# Patient Record
Sex: Female | Born: 1975 | Race: White | Hispanic: No | Marital: Married | Smoking: Never smoker
Health system: Southern US, Community
[De-identification: ages and names within clinical notes are randomized; demographics above are authoritative.]

## PROBLEM LIST (undated history)

## (undated) DIAGNOSIS — K56609 Unspecified intestinal obstruction, unspecified as to partial versus complete obstruction: Secondary | ICD-10-CM

## (undated) DIAGNOSIS — R002 Palpitations: Secondary | ICD-10-CM

## (undated) DIAGNOSIS — G43909 Migraine, unspecified, not intractable, without status migrainosus: Secondary | ICD-10-CM

## (undated) DIAGNOSIS — R202 Paresthesia of skin: Secondary | ICD-10-CM

## (undated) DIAGNOSIS — I10 Essential (primary) hypertension: Secondary | ICD-10-CM

## (undated) DIAGNOSIS — R1013 Epigastric pain: Secondary | ICD-10-CM

## (undated) DIAGNOSIS — R079 Chest pain, unspecified: Secondary | ICD-10-CM

## (undated) DIAGNOSIS — I81 Portal vein thrombosis: Secondary | ICD-10-CM

## (undated) DIAGNOSIS — E785 Hyperlipidemia, unspecified: Secondary | ICD-10-CM

## (undated) DIAGNOSIS — K649 Unspecified hemorrhoids: Secondary | ICD-10-CM

## (undated) DIAGNOSIS — K449 Diaphragmatic hernia without obstruction or gangrene: Secondary | ICD-10-CM

## (undated) DIAGNOSIS — R200 Anesthesia of skin: Secondary | ICD-10-CM

## (undated) DIAGNOSIS — Z86718 Personal history of other venous thrombosis and embolism: Secondary | ICD-10-CM

## (undated) DIAGNOSIS — G43829 Menstrual migraine, not intractable, without status migrainosus: Secondary | ICD-10-CM

## (undated) DIAGNOSIS — K5090 Crohn's disease, unspecified, without complications: Secondary | ICD-10-CM

## (undated) DIAGNOSIS — K561 Intussusception: Secondary | ICD-10-CM

## (undated) DIAGNOSIS — D649 Anemia, unspecified: Secondary | ICD-10-CM

## (undated) HISTORY — DX: Intussusception: K56.1

## (undated) HISTORY — PX: APPENDECTOMY: SHX54

## (undated) HISTORY — DX: Essential (primary) hypertension: I10

## (undated) HISTORY — DX: Hyperlipidemia, unspecified: E78.5

## (undated) HISTORY — DX: Crohn's disease, unspecified, without complications: K50.90

## (undated) HISTORY — DX: Chest pain, unspecified: R07.9

## (undated) HISTORY — DX: Portal vein thrombosis: I81

## (undated) HISTORY — DX: Unspecified hemorrhoids: K64.9

## (undated) HISTORY — DX: Menstrual migraine, not intractable, without status migrainosus: G43.829

## (undated) HISTORY — DX: Palpitations: R00.2

## (undated) HISTORY — DX: Anemia, unspecified: D64.9

## (undated) HISTORY — DX: Personal history of other venous thrombosis and embolism: Z86.718

## (undated) HISTORY — DX: Unspecified intestinal obstruction, unspecified as to partial versus complete obstruction: K56.609

## (undated) HISTORY — DX: Epigastric pain: R10.13

## (undated) HISTORY — DX: Diaphragmatic hernia without obstruction or gangrene: K44.9

---

## 2005-11-07 ENCOUNTER — Ambulatory Visit: Payer: Self-pay | Admitting: Gastroenterology

## 2006-12-14 HISTORY — PX: COLPOSCOPY: SHX161

## 2007-08-18 ENCOUNTER — Ambulatory Visit: Payer: Self-pay | Admitting: Internal Medicine

## 2007-10-16 ENCOUNTER — Ambulatory Visit: Payer: Self-pay | Admitting: Unknown Physician Specialty

## 2007-10-16 ENCOUNTER — Emergency Department: Payer: Self-pay | Admitting: Emergency Medicine

## 2008-04-16 ENCOUNTER — Emergency Department: Payer: Self-pay | Admitting: Emergency Medicine

## 2008-07-05 ENCOUNTER — Encounter: Payer: Self-pay | Admitting: Maternal & Fetal Medicine

## 2008-11-18 ENCOUNTER — Inpatient Hospital Stay: Payer: Self-pay

## 2010-03-09 ENCOUNTER — Ambulatory Visit: Payer: Self-pay | Admitting: Internal Medicine

## 2011-11-02 ENCOUNTER — Ambulatory Visit: Payer: Self-pay | Admitting: Internal Medicine

## 2013-03-08 ENCOUNTER — Emergency Department: Payer: Self-pay | Admitting: Internal Medicine

## 2013-06-19 ENCOUNTER — Encounter (INDEPENDENT_AMBULATORY_CARE_PROVIDER_SITE_OTHER): Payer: Self-pay

## 2013-06-19 ENCOUNTER — Telehealth: Payer: Self-pay | Admitting: *Deleted

## 2013-06-19 ENCOUNTER — Ambulatory Visit (INDEPENDENT_AMBULATORY_CARE_PROVIDER_SITE_OTHER): Payer: BC Managed Care – PPO | Admitting: Cardiovascular Disease

## 2013-06-19 ENCOUNTER — Encounter: Payer: Self-pay | Admitting: Cardiovascular Disease

## 2013-06-19 VITALS — BP 124/83 | HR 77 | Ht 62.0 in | Wt 127.0 lb

## 2013-06-19 DIAGNOSIS — R0789 Other chest pain: Secondary | ICD-10-CM

## 2013-06-19 DIAGNOSIS — I10 Essential (primary) hypertension: Secondary | ICD-10-CM

## 2013-06-19 DIAGNOSIS — R079 Chest pain, unspecified: Secondary | ICD-10-CM

## 2013-06-19 DIAGNOSIS — R002 Palpitations: Secondary | ICD-10-CM

## 2013-06-19 DIAGNOSIS — E785 Hyperlipidemia, unspecified: Secondary | ICD-10-CM

## 2013-06-19 HISTORY — DX: Hyperlipidemia, unspecified: E78.5

## 2013-06-19 HISTORY — DX: Essential (primary) hypertension: I10

## 2013-06-19 HISTORY — DX: Chest pain, unspecified: R07.9

## 2013-06-19 HISTORY — DX: Palpitations: R00.2

## 2013-06-19 NOTE — Patient Instructions (Addendum)
Your physician has requested that you have a stress echocardiogram. For further information please visit [email or web address] Please follow instruction sheet as given. Hold Atenolol the morning of your stress echo  Your physician recommends that you have the following labs: TSH  Lipid  Cortisol  Aldosterone/Renin Ratio   Your physician recommends that you schedule a follow-up appointment in:  As needed

## 2013-06-19 NOTE — Progress Notes (Signed)
Primary care physician: Dr. Beverely Risen  HPI  This is a pleasant 38 year old female who was referred for evaluation of hypertension and chest pain. She does not have any previous cardiac history. She has known history of hypertension which was diagnosed about 3 years ago as well as anxiety. She reports strong family history of hypertension at early age. There is also family history of premature coronary artery disease. Her first cousin died at the age of 3 for myocardial infarction but she was diabetic. Blood pressure has been reasonably controlled on amlodipine and small dose of atenolol. The dose of atenolol was recently decreased due to slow heart rate. She reports recent episodes of substernal chest pain at rest and with physical activities associated with shortness of breath. She stopped exercising for that reason. She also noticed palpitations and fast heartbeats. She had a Holter monitor done but results are not available yet. She does admit to being anxious about her health and especially cardiac status. She does not smoke. She drinks 2 cups of coffee a day she is not aware of any thyroid disorders.   Allergies  Allergen Reactions  . Iodine      No current outpatient prescriptions on file prior to visit.   No current facility-administered medications on file prior to visit.     Past Medical History  Diagnosis Date  . Hypertension      History reviewed. No pertinent past surgical history.   Family History  Problem Relation Age of Onset  . Hyperlipidemia Mother   . Hypertension Mother      History   Social History  . Marital Status: Single    Spouse Name: N/A    Number of Children: N/A  . Years of Education: N/A   Occupational History  . Not on file.   Social History Main Topics  . Smoking status: Never Smoker   . Smokeless tobacco: Not on file  . Alcohol Use: Yes     Comment: occasional  . Drug Use: No  . Sexual Activity: Not on file   Other Topics  Concern  . Not on file   Social History Narrative  . No narrative on file     ROS A 10 point review of system was performed. It is negative other than that mentioned in the history of present illness.   PHYSICAL EXAM   BP 124/83  Pulse 77  Ht 5\' 2"  (1.575 m)  Wt 127 lb (57.607 kg)  BMI 23.22 kg/m2 Constitutional: She is oriented to person, place, and time. She appears well-developed and well-nourished. No distress.  HENT: No nasal discharge.  Head: Normocephalic and atraumatic.  Eyes: Pupils are equal and round. No discharge.  Neck: Normal range of motion. Neck supple. No JVD present. No thyromegaly present.  Cardiovascular: Normal rate, regular rhythm, normal heart sounds. Exam reveals no gallop and no friction rub. No murmur heard.  Pulmonary/Chest: Effort normal and breath sounds normal. No stridor. No respiratory distress. She has no wheezes. She has no rales. She exhibits no tenderness.  Abdominal: Soft. Bowel sounds are normal. She exhibits no distension. There is no tenderness. There is no rebound and no guarding.  Musculoskeletal: Normal range of motion. She exhibits no edema and no tenderness.  Neurological: She is alert and oriented to person, place, and time. Coordination normal.  Skin: Skin is warm and dry. No rash noted. She is not diaphoretic. No erythema. No pallor.  Psychiatric: She has a normal mood and affect. Her behavior is  normal. Judgment and thought content normal.     AVW:UJWJXBEKG:Normal sinus rhythm with short PR interval. Normal QT interval PRi = 104 BORDERLINE RHYTHM   ASSESSMENT AND PLAN

## 2013-06-19 NOTE — Assessment & Plan Note (Signed)
It is possible that she has essential hypertension especially with her family history. However, given her young age I think it is reasonable to exclude secondary causes. I requested TSH, cortisol level and aldosterone renin ratio.

## 2013-06-19 NOTE — Assessment & Plan Note (Signed)
I agree that there is a component of anxiety which might be contributing. I recommend checking TSH which was requested. I will obtain the results of Holter monitor which was done recently.

## 2013-06-19 NOTE — Assessment & Plan Note (Signed)
Fasting lipid profile to be done with her other labs.

## 2013-06-19 NOTE — Telephone Encounter (Signed)
Lvm to make sure patient holds Atenolol before her stress echo

## 2013-06-19 NOTE — Assessment & Plan Note (Signed)
Chest pain is overall atypical. Risk factors include hypertension and family history of premature coronary artery disease. I recommend evaluation with a stress echocardiogram. Given history of hypertension and associated dyspnea, the echocardiogram will help with EF evaluation.

## 2013-06-24 NOTE — Telephone Encounter (Signed)
Instructed patient to hold Atenolol before her stress echo Patient verbalized understanding

## 2013-07-02 ENCOUNTER — Other Ambulatory Visit: Payer: BC Managed Care – PPO

## 2013-07-09 ENCOUNTER — Other Ambulatory Visit (INDEPENDENT_AMBULATORY_CARE_PROVIDER_SITE_OTHER): Payer: BC Managed Care – PPO

## 2013-07-09 DIAGNOSIS — R079 Chest pain, unspecified: Secondary | ICD-10-CM

## 2013-07-09 DIAGNOSIS — R002 Palpitations: Secondary | ICD-10-CM

## 2013-09-15 ENCOUNTER — Ambulatory Visit (INDEPENDENT_AMBULATORY_CARE_PROVIDER_SITE_OTHER): Payer: BC Managed Care – PPO | Admitting: *Deleted

## 2013-09-15 VITALS — BP 146/90

## 2013-09-15 DIAGNOSIS — I10 Essential (primary) hypertension: Secondary | ICD-10-CM

## 2013-09-15 MED ORDER — AMLODIPINE BESYLATE 5 MG PO TABS: 5.0000 mg | ORAL_TABLET | Freq: Every day | ORAL | Status: DC

## 2013-09-15 MED ORDER — CARVEDILOL 6.25 MG PO TABS: 6.2500 mg | ORAL_TABLET | Freq: Two times a day (BID) | ORAL | Status: DC

## 2013-09-15 NOTE — Patient Instructions (Signed)
Your physician has recommended you make the following change in your medication:  Stop Atenolol  Start Coreg 6.25 mg one tablet twice daily  Decrease Norvasc to 5 mg once daily   Your physician recommends that you return for lab work in:  Schedule your labs for this week  One of them is fasting so come early   Your physician recommends that you schedule a follow-up appointment in:  In 2 weeks with Dr. Kirke CorinArida

## 2013-09-16 ENCOUNTER — Encounter: Payer: BC Managed Care – PPO | Admitting: *Deleted

## 2013-09-17 LAB — LIPID PANEL
Chol/HDL Ratio: 3.2 ratio units (ref 0.0–4.4)
Cholesterol, Total: 266 mg/dL — ABNORMAL HIGH (ref 100–199)
HDL: 83 mg/dL (ref >39)
LDL Calculated: 160 mg/dL — ABNORMAL HIGH (ref 0–99)
Triglycerides: 113 mg/dL (ref 0–149)
VLDL Cholesterol Cal: 23 mg/dL (ref 5–40)

## 2013-09-17 LAB — TSH: TSH: 0.766 u[IU]/mL (ref 0.450–4.500)

## 2013-09-17 LAB — CORTISOL: Cortisol: 15.7 ug/dL (ref 2.3–19.4)

## 2013-09-17 NOTE — Progress Notes (Signed)
This encounter was created in error - please disregard.

## 2013-09-18 ENCOUNTER — Telehealth: Payer: Self-pay

## 2013-09-18 ENCOUNTER — Encounter: Payer: BC Managed Care – PPO | Admitting: *Deleted

## 2013-09-18 LAB — ALDOSTERONE/RENIN RATIO
ALDOS/RENIN RATIO: 5.3 (ref 0.0–30.0)
ALDOSTERONE: 49.4 ng/dL — ABNORMAL HIGH (ref 0.0–30.0)

## 2013-09-18 NOTE — Telephone Encounter (Signed)
Pt called and states her BP is still elevated 150/107, states the bottom # is not coming off 101. Feels heavy chested.

## 2013-09-18 NOTE — Telephone Encounter (Signed)
Informed patient per verbal from Dr. Kirke Corin to take extra 5 mg of Norvasc ( for a daily total of 10 mg) if blood pressure is elevated   Reviewed preliminary lab results with patient   Advised her not to use a wrist cuff to check her blood pressure  Advised her to contact ems if she feels her situation becomes emergent   Patient verbalized understanding

## 2013-09-18 NOTE — Progress Notes (Signed)
Patient walked in clinic 09/15/13 stating that she "felt funny" at work  She checked her blood pressure at work 150/107 In clinic her blood pressure was 146/90 She stated when her pressure is elevated she feels lightheaded and short of breath  Denies any other symptoms   Discussed this with Dr.Arida  He advised patient    Stop Atenolol  Start Coreg 6.25 mg one tablet twice daily  Decrease Norvasc to 5 mg once daily    Schedule your labs for this week that were ordered at previous visit   Follow up in 2 weeks with Dr. Kirke Corin    Patient verbalized understanding

## 2013-09-21 NOTE — Progress Notes (Signed)
This encounter was created in error - please disregard.

## 2013-10-08 ENCOUNTER — Ambulatory Visit (INDEPENDENT_AMBULATORY_CARE_PROVIDER_SITE_OTHER): Payer: BC Managed Care – PPO | Admitting: Cardiovascular Disease

## 2013-10-08 ENCOUNTER — Encounter: Payer: Self-pay | Admitting: Cardiovascular Disease

## 2013-10-08 VITALS — BP 130/91 | HR 73 | Ht 63.0 in | Wt 125.2 lb

## 2013-10-08 DIAGNOSIS — I10 Essential (primary) hypertension: Secondary | ICD-10-CM

## 2013-10-08 DIAGNOSIS — E785 Hyperlipidemia, unspecified: Secondary | ICD-10-CM

## 2013-10-08 MED ORDER — LOSARTAN POTASSIUM-HCTZ 50-12.5 MG PO TABS: 1.0000 | ORAL_TABLET | Freq: Every day | ORAL | Status: DC

## 2013-10-08 NOTE — Patient Instructions (Signed)
Your physician has recommended you make the following change in your medication:  Stop Amlodipine  Start Losartan-HCTZ 50/12.5 mg once daily  Your physician has requested that you have a renal artery duplex. During this test, an ultrasound is used to evaluate blood flow to the kidneys. Allow one hour for this exam. Do not eat after midnight the day before and avoid carbonated beverages. Take your medications as you usually do.   Your physician recommends that you return for lab work in:  BMP the day of your renal artery duplex  Your physician recommends that you schedule a follow-up appointment in: 3 months

## 2013-10-08 NOTE — Progress Notes (Signed)
Primary care physician: Dr. Beverely Risen  HPI  This is a pleasant 38 year old female who is here today for a followup visit regarding  hypertension and chest pain. She does not have any previous cardiac history. She has known history of hypertension which was diagnosed about 3 years ago as well as anxiety. She reports strong family history of hypertension at early age. There is also family history of premature coronary artery disease. She was seen recently for  episodes of substernal chest pain at rest and with physical activities associated with shortness of breath.  She underwent a stress echocardiogram which showed no evidence of ischemia. There was hypertensive response to exercise on was switched from atenolol to carvedilol. She reports improvement in symptoms since then. However, blood pressure is still not optimally controlled.   Allergies  Allergen Reactions  . Iodine      Current Outpatient Prescriptions on File Prior to Visit  Medication Sig Dispense Refill  . ALPRAZolam (XANAX) 0.25 MG tablet Take 0.5 mg by mouth as needed.       Marland Kitchen amLODipine (NORVASC) 5 MG tablet Take 1 tablet (5 mg total) by mouth daily.  180 tablet  3  . carvedilol (COREG) 6.25 MG tablet Take 1 tablet (6.25 mg total) by mouth 2 (two) times daily.  180 tablet  3   No current facility-administered medications on file prior to visit.     Past Medical History  Diagnosis Date  . Hypertension      History reviewed. No pertinent past surgical history.   Family History  Problem Relation Age of Onset  . Hyperlipidemia Mother   . Hypertension Mother      History   Social History  . Marital Status: Single    Spouse Name: N/A    Number of Children: N/A  . Years of Education: N/A   Occupational History  . Not on file.   Social History Main Topics  . Smoking status: Never Smoker   . Smokeless tobacco: Not on file  . Alcohol Use: Yes     Comment: occasional  . Drug Use: No  . Sexual Activity:  Not on file   Other Topics Concern  . Not on file   Social History Narrative  . No narrative on file     ROS A 10 point review of system was performed. It is negative other than that mentioned in the history of present illness.   PHYSICAL EXAM   BP 130/91  Pulse 73  Ht  (1.6 m)  Wt 125 lb 4 oz (56.813 kg)  BMI 22.19 kg/m2 Constitutional: She is oriented to person, place, and time. She appears well-developed and well-nourished. No distress.  HENT: No nasal discharge.  Head: Normocephalic and atraumatic.  Eyes: Pupils are equal and round. No discharge.  Neck: Normal range of motion. Neck supple. No JVD present. No thyromegaly present.  Cardiovascular: Normal rate, regular rhythm, normal heart sounds. Exam reveals no gallop and no friction rub. No murmur heard.  Pulmonary/Chest: Effort normal and breath sounds normal. No stridor. No respiratory distress. She has no wheezes. She has no rales. She exhibits no tenderness.  Abdominal: Soft. Bowel sounds are normal. She exhibits no distension. There is no tenderness. There is no rebound and no guarding. No abdominal bruit  Musculoskeletal: Normal range of motion. She exhibits no edema and no tenderness.  Neurological: She is alert and oriented to person, place, and time. Coordination normal.  Skin: Skin is warm and dry. No rash  noted. She is not diaphoretic. No erythema. No pallor.  Psychiatric: She has a normal mood and affect. Her behavior is normal. Judgment and thought content normal.      ASSESSMENT AND PLAN

## 2013-10-08 NOTE — Assessment & Plan Note (Signed)
Lab Results  Component Value Date   HDL 83 09/16/2013   LDLCALC 974* 09/16/2013   TRIG 113 09/16/2013   CHOLHDL 3.2 09/16/2013   We discussed the importance of lifestyle changes. Recommend repeat lipid profile in 6 months.

## 2013-10-08 NOTE — Assessment & Plan Note (Signed)
Blood pressure continues to be not optimally controlled. Workup for secondary hypertension has been negative. However, given the persistently high diastolic blood pressure, I think we have to exclude fibromuscular dysplasia causing renal artery stenosis. I requested a renal artery duplex. I stopped amlodipine and started losartan-hydrochlorothiazide. Check basic metabolic profile in 7-10 days.

## 2013-11-03 ENCOUNTER — Other Ambulatory Visit (INDEPENDENT_AMBULATORY_CARE_PROVIDER_SITE_OTHER): Payer: BC Managed Care – PPO | Admitting: *Deleted

## 2013-11-03 ENCOUNTER — Encounter (INDEPENDENT_AMBULATORY_CARE_PROVIDER_SITE_OTHER): Payer: BC Managed Care – PPO

## 2013-11-03 DIAGNOSIS — I10 Essential (primary) hypertension: Secondary | ICD-10-CM

## 2013-11-04 LAB — BASIC METABOLIC PANEL
BUN/Creatinine Ratio: 16 (ref 8–20)
BUN: 13 mg/dL (ref 6–20)
CO2: 24 mmol/L (ref 18–29)
Calcium: 9.5 mg/dL (ref 8.7–10.2)
Chloride: 103 mmol/L (ref 97–108)
Creatinine, Ser: 0.79 mg/dL (ref 0.57–1.00)
GFR calc Af Amer: 110 mL/min/{1.73_m2} (ref >59)
GFR calc non Af Amer: 95 mL/min/{1.73_m2} (ref >59)
Glucose: 90 mg/dL (ref 65–99)
Potassium: 4.9 mmol/L (ref 3.5–5.2)
Sodium: 142 mmol/L (ref 134–144)

## 2013-11-05 ENCOUNTER — Telehealth: Payer: Self-pay

## 2013-11-05 NOTE — Telephone Encounter (Signed)
I am not able to refill Xanax.

## 2013-11-05 NOTE — Telephone Encounter (Signed)
Pt called, states she needs a refill on her Xanax, states Dr. Beverely Risen will not refill until she is seen. States she has been seen here in our office a lot lately, and didn't know if Dr. Kirke Corin could fill this for her or not. Please call and advise.

## 2013-11-05 NOTE — Telephone Encounter (Signed)
Informed patient of Dr. Aridas response  Patient verbalized understanding  

## 2013-11-12 ENCOUNTER — Ambulatory Visit: Payer: BC Managed Care – PPO | Admitting: *Deleted

## 2013-11-12 ENCOUNTER — Telehealth: Payer: Self-pay

## 2013-11-12 VITALS — BP 122/86 | HR 77

## 2013-11-12 DIAGNOSIS — E785 Hyperlipidemia, unspecified: Secondary | ICD-10-CM

## 2013-11-12 MED ORDER — LOSARTAN POTASSIUM 50 MG PO TABS: 50.0000 mg | ORAL_TABLET | Freq: Every day | ORAL | Status: DC

## 2013-11-12 MED ORDER — CARVEDILOL 3.125 MG PO TABS: 3.1250 mg | ORAL_TABLET | Freq: Two times a day (BID) | ORAL | Status: DC

## 2013-11-12 NOTE — Progress Notes (Signed)
Patient walked into the office today complaining of chest tightness and a full feeling in her throat  She does not know if she is having a "problem" or anxiety  She also had a hot flash and a strange pain in her left arm  She stated that she knows she has some anxiety but is concerned because of her elevated cholesterol   Vitals and EKG were obtained  EKG was normal per Dr. Phillips Odor were WDL   She also stated that she weaned herself off Coreg on her own  She said her blood pressure has been running low  SBP consistently in the 90's  She had been feeling very lethargic   Patient would like to know if her cholesterol has come down any

## 2013-11-12 NOTE — Patient Instructions (Addendum)
Your physician has recommended you make the following change in your medication:  Decrease Coreg to 3.125 mg twice daily  Stop Hyzaar  Start Losartan 50 mg once daily   Your physician recommends that you return for lab work in:  Fasting labs next week

## 2013-11-12 NOTE — Telephone Encounter (Signed)
Pt states she has had palpitations, her shoulder is aching, and she "doesn't feel right", SOB, states she had a "hot flash, and a hard time swallowing"

## 2013-11-12 NOTE — Telephone Encounter (Signed)
Patient seen by nurse in clinic

## 2013-11-16 ENCOUNTER — Encounter: Payer: Self-pay | Admitting: Cardiovascular Disease

## 2013-11-17 ENCOUNTER — Other Ambulatory Visit: Payer: BC Managed Care – PPO

## 2013-11-19 ENCOUNTER — Other Ambulatory Visit: Payer: BC Managed Care – PPO

## 2013-11-27 ENCOUNTER — Telehealth: Payer: Self-pay

## 2013-11-27 NOTE — Telephone Encounter (Signed)
LVM @ 1016

## 2013-11-27 NOTE — Telephone Encounter (Signed)
Pt states she was recently switched to Cozaar, and is experiencing a lot of bloating and swelling. Please call.

## 2013-11-27 NOTE — Telephone Encounter (Signed)
Patient called and stated that ever since her meds were changed during her RN visit by Eula Listen PA she has felt bloated and swollen  She wonders if this is because we stopped the diuretic component of her medication  She said her stomach and hands feel bloated  I advised her to avoid sodium and see how she adjusts over the weekend

## 2013-12-02 NOTE — Telephone Encounter (Signed)
LVM to follow up w patient 10/21

## 2013-12-04 NOTE — Telephone Encounter (Signed)
I attempted to call the patient. No answer/ voice mail full.

## 2013-12-04 NOTE — Telephone Encounter (Signed)
Patient called and stated her swelling has improve  I instructed her to call with any changes  Patient verbalized understanding

## 2013-12-12 ENCOUNTER — Emergency Department: Payer: Self-pay | Admitting: Student

## 2013-12-12 LAB — URINALYSIS, COMPLETE
Bilirubin,UR: NEGATIVE
Blood: NEGATIVE
Glucose,UR: NEGATIVE mg/dL (ref 0–75)
Leukocyte Esterase: NEGATIVE
Nitrite: NEGATIVE
Ph: 5 (ref 4.5–8.0)
Protein: NEGATIVE
RBC,UR: 6 /HPF (ref 0–5)
Specific Gravity: 1.028 (ref 1.003–1.030)
Squamous Epithelial: 4
WBC UR: 1 /HPF (ref 0–5)

## 2013-12-12 LAB — CBC WITH DIFFERENTIAL/PLATELET
Basophil #: 0 10*3/uL (ref 0.0–0.1)
Basophil %: 0.7 %
Eosinophil #: 0 10*3/uL (ref 0.0–0.7)
Eosinophil %: 0 %
HCT: 38.4 % (ref 35.0–47.0)
HGB: 12.3 g/dL (ref 12.0–16.0)
Lymphocyte #: 0.6 10*3/uL — ABNORMAL LOW (ref 1.0–3.6)
Lymphocyte %: 7.7 %
MCH: 28.4 pg (ref 26.0–34.0)
MCHC: 32.1 g/dL (ref 32.0–36.0)
MCV: 88 fL (ref 80–100)
Monocyte #: 0.4 x10 3/mm (ref 0.2–0.9)
Monocyte %: 5.1 %
Neutrophil #: 6.2 10*3/uL (ref 1.4–6.5)
Neutrophil %: 86.5 %
Platelet: 208 10*3/uL (ref 150–440)
RBC: 4.34 10*6/uL (ref 3.80–5.20)
RDW: 16.4 % — ABNORMAL HIGH (ref 11.5–14.5)
WBC: 7.2 10*3/uL (ref 3.6–11.0)

## 2013-12-12 LAB — COMPREHENSIVE METABOLIC PANEL
Albumin: 3.6 g/dL (ref 3.4–5.0)
Alkaline Phosphatase: 70 U/L
Anion Gap: 11 (ref 7–16)
BUN: 10 mg/dL (ref 7–18)
Bilirubin,Total: 0.4 mg/dL (ref 0.2–1.0)
Calcium, Total: 8.5 mg/dL (ref 8.5–10.1)
Chloride: 105 mmol/L (ref 98–107)
Co2: 24 mmol/L (ref 21–32)
Creatinine: 0.67 mg/dL (ref 0.60–1.30)
EGFR (African American): >60
EGFR (Non-African Amer.): >60
Glucose: 129 mg/dL — ABNORMAL HIGH (ref 65–99)
Osmolality: 280 (ref 275–301)
Potassium: 3.6 mmol/L (ref 3.5–5.1)
SGOT(AST): 29 U/L (ref 15–37)
SGPT (ALT): 22 U/L
Sodium: 140 mmol/L (ref 136–145)
Total Protein: 7.8 g/dL (ref 6.4–8.2)

## 2013-12-12 LAB — ED INFLUENZA
H1N1 flu by pcr: NOT DETECTED
Influenza A By PCR: NEGATIVE
Influenza B By PCR: NEGATIVE

## 2013-12-12 LAB — LIPASE, BLOOD: Lipase: 70 U/L — ABNORMAL LOW (ref 73–393)

## 2013-12-13 ENCOUNTER — Emergency Department (HOSPITAL_COMMUNITY)
Admission: EM | Admit: 2013-12-13 | Discharge: 2013-12-13 | Disposition: A | Discharge status: Home / Self Care | Payer: BC Managed Care – PPO | Attending: Emergency Medicine | Admitting: Emergency Medicine

## 2013-12-13 ENCOUNTER — Emergency Department (HOSPITAL_COMMUNITY): Payer: BC Managed Care – PPO

## 2013-12-13 ENCOUNTER — Encounter (HOSPITAL_COMMUNITY): Payer: Self-pay | Admitting: Family Medicine

## 2013-12-13 DIAGNOSIS — R000 Tachycardia, unspecified: Secondary | ICD-10-CM | POA: Diagnosis not present

## 2013-12-13 DIAGNOSIS — R0602 Shortness of breath: Secondary | ICD-10-CM

## 2013-12-13 DIAGNOSIS — I10 Essential (primary) hypertension: Secondary | ICD-10-CM | POA: Diagnosis not present

## 2013-12-13 DIAGNOSIS — R079 Chest pain, unspecified: Secondary | ICD-10-CM | POA: Diagnosis present

## 2013-12-13 DIAGNOSIS — H748X1 Other specified disorders of right middle ear and mastoid: Secondary | ICD-10-CM | POA: Insufficient documentation

## 2013-12-13 DIAGNOSIS — J189 Pneumonia, unspecified organism: Secondary | ICD-10-CM

## 2013-12-13 DIAGNOSIS — Z8639 Personal history of other endocrine, nutritional and metabolic disease: Secondary | ICD-10-CM | POA: Diagnosis not present

## 2013-12-13 DIAGNOSIS — Z79899 Other long term (current) drug therapy: Secondary | ICD-10-CM | POA: Insufficient documentation

## 2013-12-13 DIAGNOSIS — J159 Unspecified bacterial pneumonia: Secondary | ICD-10-CM | POA: Diagnosis not present

## 2013-12-13 LAB — BASIC METABOLIC PANEL
Anion gap: 13 (ref 5–15)
BUN: 6 mg/dL (ref 6–23)
CO2: 23 mEq/L (ref 19–32)
Calcium: 9.1 mg/dL (ref 8.4–10.5)
Chloride: 103 mEq/L (ref 96–112)
Creatinine, Ser: 0.63 mg/dL (ref 0.50–1.10)
GFR calc Af Amer: >90 mL/min (ref >90)
GFR calc non Af Amer: >90 mL/min (ref >90)
Glucose, Bld: 98 mg/dL (ref 70–99)
Potassium: 3.4 mEq/L — ABNORMAL LOW (ref 3.7–5.3)
Sodium: 139 mEq/L (ref 137–147)

## 2013-12-13 LAB — CBC
HCT: 37.9 % (ref 36.0–46.0)
Hemoglobin: 12.6 g/dL (ref 12.0–15.0)
MCH: 28.4 pg (ref 26.0–34.0)
MCHC: 33.2 g/dL (ref 30.0–36.0)
MCV: 85.6 fL (ref 78.0–100.0)
Platelets: 201 10*3/uL (ref 150–400)
RBC: 4.43 MIL/uL (ref 3.87–5.11)
RDW: 15.7 % — ABNORMAL HIGH (ref 11.5–15.5)
WBC: 5.1 10*3/uL (ref 4.0–10.5)

## 2013-12-13 LAB — I-STAT TROPONIN, ED: Troponin i, poc: 0.01 ng/mL (ref 0.00–0.08)

## 2013-12-13 LAB — PRO B NATRIURETIC PEPTIDE: Pro B Natriuretic peptide (BNP): 319.3 pg/mL — ABNORMAL HIGH (ref 0–125)

## 2013-12-13 MED ORDER — SODIUM CHLORIDE 0.9 % IV BOLUS (SEPSIS)
1000.0000 mL | INTRAVENOUS | Status: AC
  Administered 2013-12-13: 1000 mL via INTRAVENOUS

## 2013-12-13 MED ORDER — OXYCODONE-ACETAMINOPHEN 5-325 MG PO TABS
1.0000 | ORAL_TABLET | Freq: Once | ORAL | Status: AC
  Administered 2013-12-13: 1 via ORAL
  Filled 2013-12-13: qty 1

## 2013-12-13 NOTE — Discharge Instructions (Signed)
Your shortness of breath, chest discomfort are likely due to your previous diagnosis of pneumonia. It is important for you to maintain proper hydration. Continue with your previously scheduled antibiotic regimen. Follow-up with your primary care doctor to ensure resolution of your pneumonia. Please return to ED for further evaluation if you experience fevers that do not break, difficulty breathing, blood in your cough, abdominal pain, numbness or weakness

## 2013-12-13 NOTE — ED Notes (Signed)
Dr Harrison at bedside

## 2013-12-13 NOTE — ED Notes (Addendum)
Pt c/o L sided chest pain, non radiating starting today. Pt diagnosed with pneumonia two days ago; has been taking antibiotics as prescribed. Reports productive cough with clear sputum. Chest pain associated with NV, dizziness, and increased shortness of breath. Pt also c/o bilateral ear pain, worse on R side. Rhonchi heard bilaterally. Reports fever and chills; has been alternating tylenol and ibuprofen for fever; last tylenol at 1500

## 2013-12-13 NOTE — ED Provider Notes (Signed)
CSN: [id number]     Arrival date & time 12/13/13  1549 History   First MD Initiated Contact with Patient 12/13/13 1733     Chief Complaint  Patient presents with  . Chest Pain  . Tachycardia     (Consider location/radiation/quality/duration/timing/severity/associated sxs/prior Treatment) HPI Chelsea Carroll is a 38 y.o. female history of hypertension, hypercholesterolemia who comes in for evaluation of chest pain. Patient was diagnosed 2 days ago at [facility] with upper left lobe pneumonia., Currently on azithromycin. Patient states this morning at 1 or 2 AM she began experiencing a "deep ache" in her chest that lasted approximately 3 seconds and abated. The pain will return intermittently without any specific timing or relation to exertion.  Past Medical History  Diagnosis Date  . Hypertension    History reviewed. No pertinent past surgical history. Family History  Problem Relation Age of Onset  . Hyperlipidemia Mother   . Hypertension Mother    History  Substance Use Topics  . Smoking status: Never Smoker   . Smokeless tobacco: Not on file  . Alcohol Use: Yes     Comment: occasional   OB History    No data available     Review of Systems  Constitutional: Positive for fever.  HENT: Positive for ear pain. Negative for sore throat.   Eyes: Negative for visual disturbance.  Respiratory: Positive for shortness of breath.   Cardiovascular: Positive for chest pain.  Gastrointestinal: Negative for abdominal pain.  Endocrine: Negative for polyuria.  Genitourinary: Negative for dysuria.  Skin: Negative for rash.  Neurological: Negative for headaches.      Allergies  Iodine  Home Medications   Prior to Admission medications   Medication Sig Start Date End Date Taking? Authorizing Provider  acetaminophen (TYLENOL) 500 MG tablet Take 500 mg by mouth every 6 (six) hours as needed.   Yes Historical Provider, MD  ALPRAZolam Prudy Feeler) 0.25 MG tablet Take  0.5 mg by mouth as needed.  06/15/13  Yes Historical Provider, MD  carvedilol (COREG) 3.125 MG tablet Take 1 tablet (3.125 mg total) by mouth 2 (two) times daily. 11/12/13  Yes Ryan M Dunn, PA-C  cyclobenzaprine (FLEXERIL) 5 MG tablet Take 5 mg by mouth as needed.  09/15/13  Yes Historical Provider, MD  losartan (COZAAR) 50 MG tablet Take 1 tablet (50 mg total) by mouth daily. 11/12/13  Yes Ryan M Dunn, PA-C   BP 124/81 mmHg  Pulse 91  Temp(Src) 99 F (37.2 C) (Oral)  Resp 17  SpO2 99%  LMP 12/13/2013 Physical Exam  Constitutional: She is oriented to person, place, and time. She appears well-developed and well-nourished.  HENT:  Head: Normocephalic and atraumatic.  Mouth/Throat: Oropharynx is clear and moist. No oropharyngeal exudate.  Mild effusion behind right tympanic membrane. No erythema no evidence of infection. Left TM normal  Eyes: Conjunctivae are normal. Pupils are equal, round, and reactive to light. Right eye exhibits no discharge. Left eye exhibits no discharge. No scleral icterus.  Neck: Neck supple. No JVD present.  Cardiovascular: Regular rhythm and normal heart sounds.   Pulmonary/Chest: Effort normal. No respiratory distress. She has no wheezes. She has no rales.  Abdominal: Soft. She exhibits no distension and no mass. There is no tenderness. There is no rebound and no guarding.  Musculoskeletal: She exhibits no tenderness.  Neurological: She is alert and oriented to person, place, and time.  Cranial Nerves II-XII grossly intact  Skin: Skin is warm and dry. No rash noted.  Psychiatric: She has a normal mood and affect.  Nursing note and vitals reviewed.   ED Course  Procedures (including critical care time) Labs Review Labs Reviewed  CBC - Abnormal; Notable for the following:    RDW 15.7 (*)    All other components within normal limits  BASIC METABOLIC PANEL - Abnormal; Notable for the following:    Potassium 3.4 (*)    All other components within normal limits   PRO B NATRIURETIC PEPTIDE - Abnormal; Notable for the following:    Pro B Natriuretic peptide (BNP) 319.3 (*)    All other components within normal limits  Rosezena SensorI-STAT TROPOININ, ED    Imaging Review Dg Chest 2 View  12/13/2013   CLINICAL DATA:  Diagnosed with pneumonia. Progression of the cough, chest tightness and shortness of breath.  EXAM: CHEST  2 VIEW  COMPARISON:  None.  FINDINGS: Two views of the chest demonstrate extensive airspace densities in the left perihilar and left upper lung region. Findings are most compatible with a left upper lobe pneumonia. Right lung is clear. There is levoscoliosis in the upper thoracic spine. Difficult to excluded a trace amount of left pleural fluid. Heart size is normal.  IMPRESSION: Airspace disease in the left upper lobe. Findings are compatible with pneumonia. Recommend follow up to ensure resolution.  Scoliosis in the upper thoracic spine.   Electronically Signed   By: Richarda OverlieAdam  Henn M.D.   On: 12/13/2013 17:25     EKG Interpretation   Date/Time:  Sunday December 13 2013 15:56:09 EST Ventricular Rate:  112 PR Interval:  146 QRS Duration: 92 QT Interval:  306 QTC Calculation: 417 R Axis:   95 Text Interpretation:  Sinus tachycardia Rightward axis Incomplete right  bundle branch block ST \\T \ T wave abnormality, consider inferior ischemia  ST \\T \ T wave abnormality, consider anterior ischemia no previous  Confirmed by HARRISON  MD, FORREST ([id number]) on 12/13/2013 4:57:05 PM     Meds given in ED:  Medications  sodium chloride 0.9 % bolus 1,000 mL (0 mLs Intravenous Stopped 12/13/13 2053)  oxyCODONE-acetaminophen (PERCOCET/ROXICET) 5-325 MG per tablet 1 tablet (1 tablet Oral Given 12/13/13 1922)    Discharge Medication List as of 12/13/2013  8:50 PM     Filed Vitals:   12/13/13 1930 12/13/13 1954 12/13/13 2015 12/13/13 2019  BP: 123/79 123/79 124/81   Pulse: 98 95 91   Temp:    99 F (37.2 C)  TempSrc:    Oral  Resp: 22 15 17    SpO2: 98% 94%  99%     MDM  Vitals stable - WNL -afebrile. Patient's fever reduced in ED Pt resting comfortably in ED. Pain controlled in ED. Patient states she feels much better after fluid bolus and pain medicine. PE consistent with patient's previous diagnosis of pneumonia. Low concern for other acute or emergent pathology. Patient symptomology likely from current pneumonia Labwork noncontributory Imaging--chest x-ray consistent with left upper lobe pneumonia, no other evidence of cardiopulmonary pathology. Encouraged patient to continue antibiotic regimen, symptomatically care and to follow-up with her primary care to ensure pneumonia resolution.  Discussed f/u with PCP and return precautions, pt very amenable to plan. Patient stable, in good condition and is appropriate for discharge. Prior to patient discharge, I discussed and reviewed this case with Dr.Harrison     Final diagnoses:  Community acquired pneumonia        Sharlene MottsBenjamin W Erika Slaby, PA-C 12/14/13 1141

## 2013-12-13 NOTE — ED Notes (Signed)
Per pt sts chest tightness and tach. sts seen 2 days ago and dx with Pneumonia. sts pain is worse and hard to breathe.

## 2013-12-13 NOTE — ED Notes (Signed)
Pt back from x-ray.

## 2013-12-15 ENCOUNTER — Telehealth: Payer: Self-pay | Admitting: Cardiovascular Disease

## 2013-12-15 NOTE — Telephone Encounter (Signed)
Pt had questions about meds that were given to her. bp is high and pt thinks the meds may help but at the same time it may higher the bp. Pt has not picked up the meds.

## 2013-12-16 NOTE — Telephone Encounter (Signed)
Informed patient of Dr. Aridas response  Patient verbalized understanding  

## 2013-12-16 NOTE — Telephone Encounter (Signed)
Patient currently has pneumonia  Her blood pressure has been a little elevated since she has been sick  140/98 has been her average  Her primary care provider prescribed a prednisone taper  She is afraid to take it  She wants to make sure it is all right with Dr. Kirke Corin for her to take it

## 2013-12-16 NOTE — Telephone Encounter (Signed)
Short term prednisone should be fine.

## 2013-12-18 ENCOUNTER — Ambulatory Visit: Payer: Self-pay | Admitting: Physician Assistant

## 2013-12-25 ENCOUNTER — Ambulatory Visit: Payer: Self-pay | Admitting: Physician Assistant

## 2013-12-30 ENCOUNTER — Telehealth: Payer: Self-pay

## 2013-12-30 NOTE — Telephone Encounter (Signed)
Pt states she has had pneumonia, and plural effusion. States her "heart races " every night, she has to sit up. States she can "feel the pneumonia is still there". States she is concerned about some other things. Please call.

## 2013-12-30 NOTE — Telephone Encounter (Signed)
No answer no vm 11/18

## 2014-01-15 ENCOUNTER — Ambulatory Visit (INDEPENDENT_AMBULATORY_CARE_PROVIDER_SITE_OTHER): Payer: BC Managed Care – PPO | Admitting: Cardiovascular Disease

## 2014-01-15 VITALS — BP 122/88 | HR 71

## 2014-01-15 DIAGNOSIS — E785 Hyperlipidemia, unspecified: Secondary | ICD-10-CM

## 2014-01-15 DIAGNOSIS — R0789 Other chest pain: Secondary | ICD-10-CM

## 2014-01-15 DIAGNOSIS — I10 Essential (primary) hypertension: Secondary | ICD-10-CM

## 2014-01-15 NOTE — Patient Instructions (Signed)
Follow up as needed

## 2014-01-15 NOTE — Assessment & Plan Note (Signed)
No further episodes of chest pain. 

## 2014-01-15 NOTE — Assessment & Plan Note (Signed)
Blood pressure is well controlled on current medications. She can follow-up as needed.

## 2014-01-15 NOTE — Assessment & Plan Note (Signed)
Lab Results  Component Value Date   HDL 83 09/16/2013   LDLCALC 160* 09/16/2013   TRIG 113 09/16/2013   CHOLHDL 3.2 09/16/2013   We discussed the importance of lifestyle changes. Recommend repeat lipid profile after 3-6 months of lifestyle changes.

## 2014-01-15 NOTE — Progress Notes (Signed)
Primary care physician: Dr. Beverely RisenFozia Khan  HPI  This is a pleasant 38 year old female who is here today for a followup visit regarding  hypertension and chest pain. She does not have any previous cardiac history. She has known history of hypertension which was diagnosed about 3 years ago as well as anxiety. She reports strong family history of hypertension at early age. There is also family history of premature coronary artery disease. She was seen in May for  episodes of substernal chest pain at rest and with physical activities associated with shortness of breath.  She underwent a stress echocardiogram which showed no evidence of ischemia. There was hypertensive response to exercise on was switched from atenolol to carvedilol. BP continued to be elevated. Renal artery duplex showed no evidence of renal artery stenosis.  She had pneumonia in early November. She recovered from this. She has been doing well.   Allergies  Allergen Reactions  . Codeine     Other reaction(s): Unknown  . Iodine Hives     Current Outpatient Prescriptions on File Prior to Visit  Medication Sig Dispense Refill  . acetaminophen (TYLENOL) 500 MG tablet Take 500 mg by mouth every 6 (six) hours as needed.    . ALPRAZolam (XANAX) 0.25 MG tablet Take 0.5 mg by mouth as needed.     . carvedilol (COREG) 3.125 MG tablet Take 1 tablet (3.125 mg total) by mouth 2 (two) times daily. 30 tablet 6  . cyclobenzaprine (FLEXERIL) 5 MG tablet Take 5 mg by mouth as needed.     Marland Kitchen. losartan (COZAAR) 50 MG tablet Take 1 tablet (50 mg total) by mouth daily. 30 tablet 6   No current facility-administered medications on file prior to visit.     Past Medical History  Diagnosis Date  . Hypertension      No past surgical history on file.   Family History  Problem Relation Age of Onset  . Hyperlipidemia Mother   . Hypertension Mother      History   Social History  . Marital Status: Married    Spouse Name: N/A    Number of  Children: N/A  . Years of Education: N/A   Occupational History  . Not on file.   Social History Main Topics  . Smoking status: Never Smoker   . Smokeless tobacco: Not on file  . Alcohol Use: Yes     Comment: occasional  . Drug Use: No  . Sexual Activity: Not on file   Other Topics Concern  . Not on file   Social History Narrative     ROS A 10 point review of system was performed. It is negative other than that mentioned in the history of present illness.   PHYSICAL EXAM   BP 122/88 mmHg  Pulse 71 Constitutional: She is oriented to person, place, and time. She appears well-developed and well-nourished. No distress.  HENT: No nasal discharge.  Head: Normocephalic and atraumatic.  Eyes: Pupils are equal and round. No discharge.  Neck: Normal range of motion. Neck supple. No JVD present. No thyromegaly present.  Cardiovascular: Normal rate, regular rhythm, normal heart sounds. Exam reveals no gallop and no friction rub. No murmur heard.  Pulmonary/Chest: Effort normal and breath sounds normal. No stridor. No respiratory distress. She has no wheezes. She has no rales. She exhibits no tenderness.  Abdominal: Soft. Bowel sounds are normal. She exhibits no distension. There is no tenderness. There is no rebound and no guarding. No abdominal bruit  Musculoskeletal:  Normal range of motion. She exhibits no edema and no tenderness.  Neurological: She is alert and oriented to person, place, and time. Coordination normal.  Skin: Skin is warm and dry. No rash noted. She is not diaphoretic. No erythema. No pallor.  Psychiatric: She has a normal mood and affect. Her behavior is normal. Judgment and thought content normal.   EKG: Normal sinus rhythm   ASSESSMENT AND PLAN

## 2014-01-28 ENCOUNTER — Encounter: Payer: Self-pay | Admitting: Cardiovascular Disease

## 2014-12-13 ENCOUNTER — Other Ambulatory Visit: Payer: Self-pay

## 2014-12-13 MED ORDER — CARVEDILOL 3.125 MG PO TABS: 3.1250 mg | ORAL_TABLET | Freq: Two times a day (BID) | ORAL | Status: DC

## 2014-12-24 ENCOUNTER — Other Ambulatory Visit: Payer: Self-pay | Admitting: *Deleted

## 2014-12-24 MED ORDER — LOSARTAN POTASSIUM 50 MG PO TABS: 50.0000 mg | ORAL_TABLET | Freq: Every day | ORAL | Status: DC

## 2015-02-23 ENCOUNTER — Other Ambulatory Visit: Payer: Self-pay

## 2015-02-23 ENCOUNTER — Telehealth: Payer: Self-pay | Admitting: *Deleted

## 2015-02-23 MED ORDER — LOSARTAN POTASSIUM 50 MG PO TABS: 50.0000 mg | ORAL_TABLET | Freq: Every day | ORAL | Status: DC

## 2015-02-23 NOTE — Telephone Encounter (Signed)
Losartan 50 mg refilled. 

## 2015-02-23 NOTE — Telephone Encounter (Signed)
Called 2x no answer no vm Was calling to see if patient wanted to come in for visit with Dr Kirke Corin  We had openings 02/24/15

## 2015-03-22 ENCOUNTER — Other Ambulatory Visit: Payer: Self-pay | Admitting: Physician Assistant

## 2015-04-27 ENCOUNTER — Emergency Department: Payer: BLUE CROSS/BLUE SHIELD

## 2015-04-27 ENCOUNTER — Emergency Department
Admission: EM | Admit: 2015-04-27 | Discharge: 2015-04-27 | Disposition: A | Discharge status: Home / Self Care | Payer: BLUE CROSS/BLUE SHIELD | Attending: Emergency Medicine | Admitting: Emergency Medicine

## 2015-04-27 DIAGNOSIS — R110 Nausea: Secondary | ICD-10-CM | POA: Diagnosis not present

## 2015-04-27 DIAGNOSIS — R1011 Right upper quadrant pain: Secondary | ICD-10-CM | POA: Insufficient documentation

## 2015-04-27 DIAGNOSIS — E785 Hyperlipidemia, unspecified: Secondary | ICD-10-CM | POA: Diagnosis not present

## 2015-04-27 DIAGNOSIS — Z79899 Other long term (current) drug therapy: Secondary | ICD-10-CM | POA: Insufficient documentation

## 2015-04-27 DIAGNOSIS — I10 Essential (primary) hypertension: Secondary | ICD-10-CM | POA: Insufficient documentation

## 2015-04-27 DIAGNOSIS — Z8719 Personal history of other diseases of the digestive system: Secondary | ICD-10-CM | POA: Insufficient documentation

## 2015-04-27 DIAGNOSIS — Z791 Long term (current) use of non-steroidal anti-inflammatories (NSAID): Secondary | ICD-10-CM | POA: Insufficient documentation

## 2015-04-27 DIAGNOSIS — F1099 Alcohol use, unspecified with unspecified alcohol-induced disorder: Secondary | ICD-10-CM | POA: Insufficient documentation

## 2015-04-27 LAB — CBC WITH DIFFERENTIAL/PLATELET
Basophils Absolute: 0.1 10*3/uL (ref 0–0.1)
Basophils Relative: 2 %
Eosinophils Absolute: 0.1 10*3/uL (ref 0–0.7)
Eosinophils Relative: 1 %
HCT: 38.1 % (ref 35.0–47.0)
Hemoglobin: 12.8 g/dL (ref 12.0–16.0)
Lymphocytes Relative: 31 %
Lymphs Abs: 2.3 10*3/uL (ref 1.0–3.6)
MCH: 30.7 pg (ref 26.0–34.0)
MCHC: 33.5 g/dL (ref 32.0–36.0)
MCV: 91.7 fL (ref 80.0–100.0)
Monocytes Absolute: 0.6 10*3/uL (ref 0.2–0.9)
Monocytes Relative: 8 %
Neutro Abs: 4.3 10*3/uL (ref 1.4–6.5)
Neutrophils Relative %: 58 %
Platelets: 241 10*3/uL (ref 150–440)
RBC: 4.16 MIL/uL (ref 3.80–5.20)
RDW: 13.8 % (ref 11.5–14.5)
WBC: 7.4 10*3/uL (ref 3.6–11.0)

## 2015-04-27 LAB — URINALYSIS COMPLETE WITH MICROSCOPIC (ARMC ONLY)
Bacteria, UA: NONE SEEN
Bilirubin Urine: NEGATIVE
Glucose, UA: NEGATIVE mg/dL
Hgb urine dipstick: NEGATIVE
Leukocytes, UA: NEGATIVE
Nitrite: NEGATIVE
Protein, ur: NEGATIVE mg/dL
Specific Gravity, Urine: 1.003 — ABNORMAL LOW (ref 1.005–1.030)
pH: 6 (ref 5.0–8.0)

## 2015-04-27 LAB — COMPREHENSIVE METABOLIC PANEL
ALT: 14 U/L (ref 14–54)
AST: 23 U/L (ref 15–41)
Albumin: 4.6 g/dL (ref 3.5–5.0)
Alkaline Phosphatase: 52 U/L (ref 38–126)
Anion gap: 9 (ref 5–15)
BUN: 9 mg/dL (ref 6–20)
CO2: 24 mmol/L (ref 22–32)
Calcium: 9.1 mg/dL (ref 8.9–10.3)
Chloride: 105 mmol/L (ref 101–111)
Creatinine, Ser: 0.62 mg/dL (ref 0.44–1.00)
GFR calc Af Amer: >60 mL/min (ref >60)
GFR calc non Af Amer: >60 mL/min (ref >60)
Glucose, Bld: 85 mg/dL (ref 65–99)
Potassium: 3.1 mmol/L — ABNORMAL LOW (ref 3.5–5.1)
Sodium: 138 mmol/L (ref 135–145)
Total Bilirubin: 1.2 mg/dL (ref 0.3–1.2)
Total Protein: 7.9 g/dL (ref 6.5–8.1)

## 2015-04-27 LAB — PREGNANCY, URINE: Preg Test, Ur: NEGATIVE

## 2015-04-27 LAB — LIPASE, BLOOD: Lipase: 15 U/L (ref 11–51)

## 2015-04-27 MED ORDER — HYDROMORPHONE HCL 1 MG/ML IJ SOLN
0.5000 mg | Freq: Once | INTRAMUSCULAR | Status: DC
  Filled 2015-04-27: qty 1

## 2015-04-27 MED ORDER — ONDANSETRON HCL 4 MG PO TABS: 4.0000 mg | ORAL_TABLET | Freq: Three times a day (TID) | ORAL | Status: DC | PRN

## 2015-04-27 MED ORDER — PANTOPRAZOLE SODIUM 20 MG PO TBEC: 20.0000 mg | DELAYED_RELEASE_TABLET | Freq: Every day | ORAL | Status: DC

## 2015-04-27 MED ORDER — SODIUM CHLORIDE 0.9 % IV BOLUS (SEPSIS)
500.0000 mL | Freq: Once | INTRAVENOUS | Status: AC
  Administered 2015-04-27: 500 mL via INTRAVENOUS

## 2015-04-27 MED ORDER — TRAMADOL HCL 50 MG PO TABS: 50.0000 mg | ORAL_TABLET | Freq: Four times a day (QID) | ORAL | Status: DC | PRN

## 2015-04-27 MED ORDER — ONDANSETRON HCL 4 MG/2ML IJ SOLN
4.0000 mg | Freq: Once | INTRAMUSCULAR | Status: DC
  Filled 2015-04-27: qty 2

## 2015-04-27 NOTE — ED Notes (Signed)
Patient transported to Ultrasound 

## 2015-04-27 NOTE — ED Provider Notes (Signed)
Time Seen: ----------------------------------------- 1:10 PM on 04/27/2015 -----------------------------------------   Chief Complaint: Abdominal Pain   History of Present Illness: Chelsea Carroll is a 40 y.o. female who presents with a one to two-week history of intermittent right upper quadrant abdominal pain with feeling of abdominal gas and bloating. She states she's had some constipation and states nausea with meals. Pain radiates in the right upper quadrant toward the middle of her chest and toward her right shoulder area. She volunteers a history of a hiatal hernia. She has nausea but no persistent vomiting. She is not aware of any fever at home. She denies any dysuria, hematuria, or urinary frequency. Patient denies any vaginal discharge or bleeding.   Past Medical History  Diagnosis Date  . Hypertension     Patient Active Problem List   Diagnosis Date Noted  . Heart palpitations 06/19/2013  . Chest pain 06/19/2013  . HTN (hypertension) 06/19/2013  . Hyperlipidemia 06/19/2013    No past surgical history on file.  No past surgical history on file.  Current Outpatient Rx  Name  Route  Sig  Dispense  Refill  . acetaminophen (TYLENOL) 500 MG tablet   Oral   Take 500 mg by mouth every 6 (six) hours as needed.         . ALPRAZolam (XANAX) 0.25 MG tablet   Oral   Take 0.5 mg by mouth as needed.          . carvedilol (COREG) 3.125 MG tablet   Oral   Take 1 tablet (3.125 mg total) by mouth 2 (two) times daily.   30 tablet   6   . cyclobenzaprine (FLEXERIL) 5 MG tablet   Oral   Take 5 mg by mouth as needed.          Marland Kitchen losartan (COZAAR) 50 MG tablet      TAKE 1 TABLET (50 MG TOTAL) BY MOUTH DAILY.   30 tablet   0     Pt needs to contact office to schedule future appo ...     Allergies:  Codeine and Iodine  Family History: Family History  Problem Relation Age of Onset  . Hyperlipidemia Mother   . Hypertension Mother     Social  History: Social History  Substance Use Topics  . Smoking status: Never Smoker   . Smokeless tobacco: Not on file  . Alcohol Use: Yes     Comment: occasional     Review of Systems:   10 point review of systems was performed and was otherwise negative:  Constitutional: No fever Eyes: No visual disturbances ENT: No sore throat, ear pain Cardiac: No chest pain Respiratory: No shortness of breath, wheezing, or stridor Abdomen: Patient denies any lower abdominal pain, no diarrhea with feelings of constipation and bloating. Endocrine: No weight loss, No night sweats Extremities: No peripheral edema, cyanosis Skin: No rashes, easy bruising Neurologic: No focal weakness, trouble with speech or swollowing Urologic: No dysuria, Hematuria, or urinary frequency   Physical Exam:  ED Triage Vitals  Enc Vitals Group     BP 04/27/15 1251 153/103 mmHg     Pulse Rate 04/27/15 1251 94     Resp 04/27/15 1251 16     Temp 04/27/15 1251 98.1 F (36.7 C)     Temp Source 04/27/15 1251 Oral     SpO2 04/27/15 1251 100 %     Weight --      Height --      Head Cir --  Peak Flow --      Pain Score 04/27/15 1252 5     Pain Loc --      Pain Edu? --      Excl. in GC? --     General: Awake , Alert , and Oriented times 3; GCS 15 Anxious Head: Normal cephalic , atraumatic Eyes: Pupils equal , round, reactive to light Nose/Throat: No nasal drainage, patent upper airway without erythema or exudate.  Neck: Supple, Full range of motion, No anterior adenopathy or palpable thyroid masses Lungs: Clear to ascultation without wheezes , rhonchi, or rales Heart: Regular rate, regular rhythm without murmurs , gallops , or rubs Abdomen: Tenderness is primarily in the right upper quadrant without peritoneal signs. Positive Murphy's sign. No focal tenderness over McBurney's point. No palpable masses. No reproducible flank discomfort        Extremities: 2 plus symmetric pulses. No edema, clubbing or  cyanosis Neurologic: normal ambulation, Motor symmetric without deficits, sensory intact Skin: warm, dry, no rashes   Labs:   All laboratory work was reviewed including any pertinent negatives or positives listed below:  Labs Reviewed  CBC WITH DIFFERENTIAL/PLATELET  COMPREHENSIVE METABOLIC PANEL  LIPASE, BLOOD  URINALYSIS COMPLETEWITH MICROSCOPIC ([facility] ONLY)  POC URINE PREG, ED  Laboratory work was reviewed and showed no clinically significant abnormalities.   Radiology:     CLINICAL DATA: Two week history of right upper quadrant abdominal pain.  EXAM: US ABDOMEN LIMITED - RIGHT UPPER QUADRANT  COMPARISON: 12/12/2013  FINDINGS: Gallbladder:  No gallstones or gallbladder wall thickening. No pericholecystic fluid. The sonographer reports no sonographic Murphy's sign.  Common bile duct:  Diameter: 5 mm.  Liver:  No focal lesion identified. Within normal limits in parenchymal echogenicity.  IMPRESSION: Normal right upper quadrant ultrasound exam.   Electronically Signed  I personally reviewed the radiologic studies   ED Course: * Patient's stay was uneventful and her workup primarily was around epigastric right upper quadrant abdominal pain. She also has some mild reflux-type symptoms and felt this may be related to reflux esophagitis. Patient I felt did not require inpatient management or had any indications of a surgical abdomen at this point. He describes some pleuritic component though I felt this was unlikely to be a pulmonary embolism or a community-acquired pneumonia given her current lack of risk factors and her clinical findings at this time.    Assessment:  Acute right upper quadrant abdominal pain  Final Clinical Impression:   Final diagnoses:  Right upper quadrant abdominal pain     Plan:  Outpatient management Patient was advised to return immediately if condition worsens. Patient was advised to follow up with their primary care  physician or other specialized physicians involved in their outpatient care. The patient and/or family member/power of attorney had laboratory results reviewed at the bedside. All questions and concerns were addressed and appropriate discharge instructions were distributed by the nursing staff. Patient will be prescribed proton next, Ultram, and Zofran on an outpatient basis. She was advised to workup is not complete and she needed to contact her primary physician            Jennye Moccasin, MD 04/27/15 1531

## 2015-04-27 NOTE — Discharge Instructions (Signed)
Abdominal Pain, Adult Many things can cause abdominal pain. Usually, abdominal pain is not caused by a disease and will improve without treatment. It can often be observed and treated at home. Your health care provider will do a physical exam and possibly order blood tests and X-rays to help determine the seriousness of your pain. However, in many cases, more time must pass before a clear cause of the pain can be found. Before that point, your health care provider may not know if you need more testing or further treatment. HOME CARE INSTRUCTIONS Monitor your abdominal pain for any changes. The following actions may help to alleviate any discomfort you are experiencing:  Only take over-the-counter or prescription medicines as directed by your health care provider.  Do not take laxatives unless directed to do so by your health care provider.  Try a clear liquid diet (broth, tea, or water) as directed by your health care provider. Slowly move to a bland diet as tolerated. SEEK MEDICAL CARE IF:  You have unexplained abdominal pain.  You have abdominal pain associated with nausea or diarrhea.  You have pain when you urinate or have a bowel movement.  You experience abdominal pain that wakes you in the night.  You have abdominal pain that is worsened or improved by eating food.  You have abdominal pain that is worsened with eating fatty foods.  You have a fever. SEEK IMMEDIATE MEDICAL CARE IF:  Your pain does not go away within 2 hours.  You keep throwing up (vomiting).  Your pain is felt only in portions of the abdomen, such as the right side or the left lower portion of the abdomen.  You pass bloody or black tarry stools. MAKE SURE YOU:  Understand these instructions.  Will watch your condition.  Will get help right away if you are not doing well or get worse.   This information is not intended to replace advice given to you by your health care provider. Make sure you discuss  any questions you have with your health care provider.   Document Released: 11/08/2004 Document Revised: 10/20/2014 Document Reviewed: 10/08/2012 Elsevier Interactive Patient Education [employer]  Please return immediately if condition worsens. Please contact her primary physician or the physician you were given for referral. If you have any specialist physicians involved in her treatment and plan please also contact them. Thank you for using [facility] [facility]. Return especially for fever, blood in stool, shortness of breath, or any other new concerns

## 2015-04-27 NOTE — ED Notes (Addendum)
Pt in via triage w/ complaints of abdominal swelling x 1-2 weeks.  Pt reports new onset pain today in RUQ which radiates around to back.  Pt reports nausea, denies any vomiting.  Pt reports hx of hiatal hernia.  Pt A/Ox4, vitals WDL.  MD at bedside.

## 2015-04-27 NOTE — ED Notes (Signed)
Pt here for right lower abdominal pain that radiates around right side.  Reports that she feels a "knot"

## 2015-04-27 NOTE — ED Notes (Signed)
Pt reports she is unable to urinate at this time; RN will check back with her later in attempt to collect urine specimen.

## 2015-04-28 ENCOUNTER — Telehealth: Payer: Self-pay | Admitting: Cardiovascular Disease

## 2015-04-28 ENCOUNTER — Other Ambulatory Visit: Payer: Self-pay

## 2015-04-28 MED ORDER — LOSARTAN POTASSIUM 50 MG PO TABS: ORAL_TABLET | ORAL | Status: DC

## 2015-04-28 NOTE — Telephone Encounter (Signed)
°*  STAT* If patient is at the pharmacy, call can be transferred to refill team.   1. Which medications need to be refilled? (please list name of each medication and dose if known) Losartin 50 mg po once daily   2. Which pharmacy/location (including street and city if local pharmacy) is medication to be sent to? [facility] [address] [place]   3. Do they need a 30 day or 90 day supply? 30 Patient will be out in 2 doses

## 2015-04-28 NOTE — Telephone Encounter (Signed)
S/w pt who states she has been having stomach issues and thinks it could be related to the losartan. She stopped taking it but BP was too elevated. She resumed losartan 50mg  daily. Informed pt I could send in only one more refill as she is overdue to f/u with Dr. Kirke Corin. Pt agreeable w/plan as well as April 14 appt. I have added her to wait list. Prescription submitted.

## 2015-05-27 ENCOUNTER — Ambulatory Visit (INDEPENDENT_AMBULATORY_CARE_PROVIDER_SITE_OTHER): Payer: BLUE CROSS/BLUE SHIELD | Admitting: Cardiovascular Disease

## 2015-05-27 ENCOUNTER — Encounter: Payer: Self-pay | Admitting: Cardiovascular Disease

## 2015-05-27 VITALS — BP 110/82 | HR 73 | Ht 63.0 in | Wt 131.0 lb

## 2015-05-27 DIAGNOSIS — I10 Essential (primary) hypertension: Secondary | ICD-10-CM

## 2015-05-27 DIAGNOSIS — R002 Palpitations: Secondary | ICD-10-CM | POA: Diagnosis not present

## 2015-05-27 MED ORDER — AMLODIPINE BESYLATE 5 MG PO TABS: 5.0000 mg | ORAL_TABLET | Freq: Every day | ORAL | Status: DC

## 2015-05-27 NOTE — Patient Instructions (Signed)
Medication Instructions: Stop taking Losartan Start Amlodipine 5 mg once daily.   Labwork: None.   Procedures/Testing: None.   Follow-Up: 1 year with Dr. Kirke Corin.   Any Additional Special Instructions Will Be Listed Below (If Applicable).     If you need a refill on your cardiac medications before your next appointment, please call your pharmacy.

## 2015-05-27 NOTE — Progress Notes (Signed)
Cardiology Office Note   Date:  05/27/2015   ID:  Chelsea Carroll, DOB 1975/09/30, MRN [id number]  PCP:  Chelsea Code, MD  Cardiologist:   Chelsea Bears, MD   Chief Complaint  Patient presents with  . other    Elevated BP and stomach pain when taking Losartan. Meds reviewed verbally with pt.      History of Present Illness: Chelsea Carroll is a 40 y.o. female who presents for a followup visit regarding  hypertension and chest pain.   She has strong family history of hypertension at early age. There is also family history of premature coronary artery disease.   Stress echocardiogram in May 2015 was normal.   Renal artery duplex in September 2015 showed no evidence of renal artery stenosis.  She has been doing well with overall controlled blood pressure and no recurrent symptoms. However, she is complaining of increased abdominal pain every time she takes losartan. The symptoms disappeared after she stopped the medication for a few days. No shortness of breath. She has mild palpitations controlled with carvedilol.   Past Medical History  Diagnosis Date  . Hypertension     History reviewed. No pertinent past surgical history.   Current Outpatient Prescriptions  Medication Sig Dispense Refill  . carvedilol (COREG) 3.125 MG tablet Take 1 tablet (3.125 mg total) by mouth 2 (two) times daily. 30 tablet 6  . losartan (COZAAR) 50 MG tablet TAKE 1 TABLET (50 MG TOTAL) BY MOUTH DAILY. 30 tablet 0   No current facility-administered medications for this visit.    Allergies:   Codeine and Iodine    Social History:  The patient  reports that she has never smoked. She does not have any smokeless tobacco history on file. She reports that she drinks alcohol. She reports that she does not use illicit drugs.   Family History:  The patient's family history includes Hyperlipidemia in her mother; Hypertension in her mother.    ROS:  Please see the history of present illness.    Otherwise, review of systems are positive for none.   All other systems are reviewed and negative.    PHYSICAL EXAM: VS:  BP 110/82 mmHg  Pulse 73  Ht  (1.6 m)  Wt 131 lb (59.421 kg)  BMI 23.21 kg/m2 , BMI Body mass index is 23.21 kg/(m^2). GEN: Well nourished, well developed, in no acute distress HEENT: normal Neck: no JVD, carotid bruits, or masses Cardiac: RRR; no murmurs, rubs, or gallops,no edema  Respiratory:  clear to auscultation bilaterally, normal work of breathing GI: soft, nontender, nondistended, + BS MS: no deformity or atrophy Skin: warm and dry, no rash Neuro:  Strength and sensation are intact Psych: euthymic mood, full affect   EKG:  EKG is ordered today. The ekg ordered today demonstrates normal sinus rhythm with sinus arrhythmia.   Recent Labs: 04/27/2015: ALT 14; BUN 9; Creatinine, Ser 0.62; Hemoglobin 12.8; Platelets 241; Potassium 3.1*; Sodium 138    Lipid Panel    Component Value Date/Time   CHOL 266* 09/16/2013 0857   TRIG 113 09/16/2013 0857   HDL 83 09/16/2013 0857   CHOLHDL 3.2 09/16/2013 0857   LDLCALC 160* 09/16/2013 0857      Wt Readings from Last 3 Encounters:  05/27/15 131 lb (59.421 kg)  10/08/13 125 lb 4 oz (56.813 kg)  06/19/13 127 lb (57.607 kg)        ASSESSMENT AND PLAN:  1.  Essential hypertension: Blood pressure  is well controlled. However, she seems to be having GI side effects from losartan. Thus, I switched her from losartan to amlodipine 5 mg once daily.  2. Hyperlipidemia: Previous LDL was 160 in 2015. She is going to get labs in the near future and I asked her to give me a copy.  3. Family history of coronary artery disease: I suggested CT calcium score once she is around 40 years old.    Disposition:   FU with me in 1 year  Signed,  Chelsea Bears, MD  05/27/2015 9:37 AM    [facility]

## 2015-06-09 ENCOUNTER — Ambulatory Visit: Payer: BLUE CROSS/BLUE SHIELD | Admitting: Cardiovascular Disease

## 2015-06-16 ENCOUNTER — Telehealth: Payer: Self-pay | Admitting: Cardiovascular Disease

## 2015-06-16 NOTE — Telephone Encounter (Signed)
Pt c/o medication issue:  1. Name of Medication: Amlodipine 10 mg   2. How are you currently taking this medication (dosage and times per day)? Took it at night around bedtime   3. Are you having a reaction (difficulty breathing--STAT)? No   4. What is your medication issue? Having horrible headaches, sometimes nauseated  Had some Losartan left over and went back on that. Wants to know What can she do about this.  Can't seem to take this anymore  Please advise.

## 2015-06-17 ENCOUNTER — Other Ambulatory Visit: Payer: Self-pay

## 2015-06-17 MED ORDER — CARVEDILOL 6.25 MG PO TABS: 6.2500 mg | ORAL_TABLET | Freq: Two times a day (BID) | ORAL | Status: DC

## 2015-06-17 NOTE — Telephone Encounter (Signed)
Attempted to contact pt. No answer, no VM set up. Will call again.

## 2015-06-17 NOTE — Telephone Encounter (Signed)
Pt called back to report symptoms since changing BP meds at her 4/14 OV. Losartan 50mg  once daily was d/c'd d/t GI issues. Amlodipine 5mg  qd added. Since this time, she has experienced headaches and occasional nausea. She stopped taking amlodipine and resumed losartan. Headaches improved but she is having GI issues again d/t losartan. She also takes coreg 3.125mg  BID. Informed pt I will forward information to MD for review and recommendations regarding alternate BP meds. She is agreeable w/plan w/no further questions.

## 2015-06-17 NOTE — Telephone Encounter (Signed)
S/w pt of MD recommendations. Pt verbalized understanding and is agreeable w/plan. Coreg prescription sent.

## 2015-06-17 NOTE — Telephone Encounter (Signed)
Stop Amlodipine and Losartan.  Increase Coreg to 6.25 mg bid.

## 2015-07-27 ENCOUNTER — Telehealth: Payer: Self-pay | Admitting: Cardiovascular Disease

## 2015-07-27 ENCOUNTER — Other Ambulatory Visit: Payer: Self-pay

## 2015-07-27 DIAGNOSIS — G43829 Menstrual migraine, not intractable, without status migrainosus: Secondary | ICD-10-CM

## 2015-07-27 HISTORY — DX: Menstrual migraine, not intractable, without status migrainosus: G43.829

## 2015-07-27 NOTE — Telephone Encounter (Signed)
S/w pt who reports BP 173/109, HR 73 during today's neurology visit for migraines. She does not take BP at home but feels it continues to be elevated. She has been taking coreg 6.25mg  BID since May 5. Pt could not tolerate losartan d/t GI issues and felt amlodipine gave her headaches however, states HA continued after d/c'ing amlodipine in May. Neurologist prescribed lisinopril 2.5mg  and nortriptyline. She does not want to take either without Dr. Jari Sportsman approval. She went home after her appt and took a losartan "just to get my blood pressure down" before returning to work. Pt states she previously took HCTZ prescribed by other MD and would like to consider this medication again.  Advised pt to purchase BP cuff, monitor at home and report readings to [place]. Will forward to Dr. Kirke Corin for further advise.

## 2015-07-27 NOTE — Telephone Encounter (Signed)
Pt calling stating she has some questions on medications we placed her on and her BP  She is taking Coreg 2 x a day 6.25 173/109 she doesn't think it is  working    Was also asking about the lisinopril. Not sure how or when she is to do that. She states she feel like he just gave her medications and she left.  She went to [facility] doctor today for headaches Nortriptyline is what the Nerologist doctor just placed her on. (scared to take that, for it can cause her BP to rise) Not sure what to do  Please advise.

## 2015-07-28 ENCOUNTER — Encounter: Payer: Self-pay | Admitting: Emergency Medicine

## 2015-07-28 ENCOUNTER — Emergency Department: Payer: BLUE CROSS/BLUE SHIELD

## 2015-07-28 ENCOUNTER — Emergency Department
Admission: EM | Admit: 2015-07-28 | Discharge: 2015-07-28 | Disposition: A | Discharge status: Home / Self Care | Payer: BLUE CROSS/BLUE SHIELD | Attending: Emergency Medicine | Admitting: Emergency Medicine

## 2015-07-28 DIAGNOSIS — Z79899 Other long term (current) drug therapy: Secondary | ICD-10-CM | POA: Insufficient documentation

## 2015-07-28 DIAGNOSIS — G43809 Other migraine, not intractable, without status migrainosus: Secondary | ICD-10-CM | POA: Diagnosis not present

## 2015-07-28 DIAGNOSIS — I10 Essential (primary) hypertension: Secondary | ICD-10-CM | POA: Insufficient documentation

## 2015-07-28 DIAGNOSIS — E785 Hyperlipidemia, unspecified: Secondary | ICD-10-CM | POA: Diagnosis not present

## 2015-07-28 DIAGNOSIS — M542 Cervicalgia: Secondary | ICD-10-CM | POA: Diagnosis present

## 2015-07-28 HISTORY — DX: Migraine, unspecified, not intractable, without status migrainosus: G43.909

## 2015-07-28 MED ORDER — PROCHLORPERAZINE EDISYLATE 5 MG/ML IJ SOLN
10.0000 mg | Freq: Once | INTRAMUSCULAR | Status: AC
  Administered 2015-07-28: 10 mg via INTRAVENOUS
  Filled 2015-07-28: qty 2

## 2015-07-28 MED ORDER — PROCHLORPERAZINE MALEATE 10 MG PO TABS: 10.0000 mg | ORAL_TABLET | Freq: Three times a day (TID) | ORAL | Status: DC | PRN

## 2015-07-28 MED ORDER — SODIUM CHLORIDE 0.9 % IV BOLUS (SEPSIS)
1000.0000 mL | Freq: Once | INTRAVENOUS | Status: AC
  Administered 2015-07-28: 1000 mL via INTRAVENOUS

## 2015-07-28 NOTE — ED Notes (Signed)
Spoke with Dr. Pershing Proud for orders, verbal order given for CT head Wo contrast.

## 2015-07-28 NOTE — Telephone Encounter (Signed)
Lisinopril and Nortriptyline are fine.

## 2015-07-28 NOTE — Telephone Encounter (Signed)
Attempted to contact pt. No answer, vm box is full.  

## 2015-07-28 NOTE — ED Provider Notes (Signed)
[facility] Emergency Department Provider Note    ____________________________________________  Time seen: ~0705 I have reviewed the triage vital signs and the nursing notes.   HISTORY  Chief Complaint Migraine and Hypertension   History limited by: Not Limited   HPI Chelsea Carroll is a 40 y.o. female Street migraine headaches who presents to the emergency department today because of migraine.The headache became severe yesterday. It is located on the right side. It is worse with lights. She has a history of migraines usually come during her menstrual cycle. This feels like her typical migraines. It is accompanied by some right sided neck pain which is typical. She saw a neurologist yesterday who prescribed a medication which the patient has been hesitant to take because it might further rates her blood pressure. She does have a history of high blood pressure. She is waiting to hear back from her cardiologist before she takes this medication. Patient has not had any vomiting. No fevers.   Past Medical History  Diagnosis Date  . Hypertension   . Migraines     Patient Active Problem List   Diagnosis Date Noted  . Heart palpitations 06/19/2013  . Chest pain 06/19/2013  . HTN (hypertension) 06/19/2013  . Hyperlipidemia 06/19/2013    History reviewed. No pertinent past surgical history.  Current Outpatient Rx  Name  Route  Sig  Dispense  Refill  . carvedilol (COREG) 6.25 MG tablet   Oral   Take 1 tablet (6.25 mg total) by mouth 2 (two) times daily.   60 tablet   11     Allergies Codeine and Iodine  Family History  Problem Relation Age of Onset  . Hyperlipidemia Mother   . Hypertension Mother     Social History Social History  Substance Use Topics  . Smoking status: Never Smoker   . Smokeless tobacco: None  . Alcohol Use: Yes     Comment: occasional    Review of Systems  Constitutional: Negative for fever. Cardiovascular:  Negative for chest pain. Respiratory: Negative for shortness of breath. Gastrointestinal: Negative for abdominal pain, vomiting and diarrhea. Neurological: Positive for headache  10-point ROS otherwise negative.  ____________________________________________   PHYSICAL EXAM:  VITAL SIGNS: ED Triage Vitals  Enc Vitals Group     BP 07/28/15 0540 155/102 mmHg     Pulse Rate 07/28/15 0540 79     Resp 07/28/15 0540 18     Temp 07/28/15 0540 98.5 F (36.9 C)     Temp Source 07/28/15 0540 Oral     SpO2 07/28/15 0540 100 %     Weight 07/28/15 0540 134 lb (60.782 kg)     Height 07/28/15 0540  (1.6 m)     Head Cir --      Peak Flow --      Pain Score 07/28/15 0540 8   Constitutional: Alert and oriented. Appears uncomfortable, shielding her eyes from the light. Eyes: Conjunctivae are normal. PERRL. Normal extraocular movements. ENT   Head: Normocephalic and atraumatic.   Nose: No congestion/rhinnorhea.   Mouth/Throat: Mucous membranes are moist.   Neck: No stridor. Hematological/Lymphatic/Immunilogical: No cervical lymphadenopathy. Cardiovascular: Normal rate, regular rhythm.  No murmurs, rubs, or gallops. Respiratory: Normal respiratory effort without tachypnea nor retractions. Breath sounds are clear and equal bilaterally. No wheezes/rales/rhonchi. Gastrointestinal: Soft and nontender. No distention.  Genitourinary: Deferred Musculoskeletal: Normal range of motion in all extremities. No joint effusions.  No lower extremity tenderness nor edema. Neurologic:  Normal speech and  language. No gross focal neurologic deficits are appreciated.  Skin:  Skin is warm, dry and intact. No rash noted. Psychiatric: Mood and affect are normal. Speech and behavior are normal. Patient exhibits appropriate insight and judgment.  ____________________________________________    LABS (pertinent  positives/negatives)  None  ____________________________________________   EKG  I, Phineas Semen, attending physician, personally viewed and interpreted this EKG  EKG Time: 0552 Rate: 62 Rhythm: normal sinus rhythm Axis: normal Intervals: qtc 432 QRS: narrow ST changes: no st elevation Impression: normal  ____________________________________________    RADIOLOGY  CT head IMPRESSION: Normal head CT. No acute intracranial process identified.  ____________________________________________   PROCEDURES  Procedure(s) performed: None  Critical Care performed: No  ____________________________________________   INITIAL IMPRESSION / ASSESSMENT AND PLAN / ED COURSE  Pertinent labs & imaging results that were available during my care of the patient were reviewed by me and considered in my medical decision making (see chart for details).  Patient presents to the emergency department today because of concerns for migraine headache. Patient has history migraine headaches. Saw neurologist yesterday however did not take the medication prescribed. On exam patient appears uncomfortable. Head CT was negative. Will treat migraine headache and reassess.  ----------------------------------------- 8:21 AM on 07/28/2015 -----------------------------------------  Patient feels improved. States she feels like she can likely manage the pain at home. Will let fluids finish and discharge.   ____________________________________________   FINAL CLINICAL IMPRESSION(S) / ED DIAGNOSES  Final diagnoses:  Other type of migraine     Note: This dictation was prepared with Dragon dictation. Any transcriptional errors that result from this process are unintentional    Phineas Semen, MD 07/28/15 [phone]

## 2015-07-28 NOTE — ED Notes (Signed)
meds administered as ordered   Pt alert and oriented  7/10 right sided headache pain present

## 2015-07-28 NOTE — ED Notes (Signed)
Patient presents to ED with c/o right sided headache for a day, states "its the worst migraine ever" Patient reports high blood pressure for about a day. Pt states was seen by neurologist yesterday for migraine and they prescribed Nortriptyline, pt reports has not taken it because wanted to get approval from cardiologist first. Patient states neck stiffness and a knot to right side of head. Patient states noticed at little  blurry vision and nausea. Patient states light sensitive.  Patient has hx of migraines but no medications are working for her.

## 2015-07-28 NOTE — Discharge Instructions (Signed)
Please seek medical attention for any high fevers, chest pain, shortness of breath, change in behavior, persistent vomiting, bloody stool or any other new or concerning symptoms. ° ° °Migraine Headache °A migraine headache is an intense, throbbing pain on one or both sides of your head. A migraine can last for 30 minutes to several hours. °CAUSES  °The exact cause of a migraine headache is not always known. However, a migraine may be caused when nerves in the brain become irritated and release chemicals that cause inflammation. This causes pain. °Certain things may also trigger migraines, such as: °· Alcohol. °· Smoking. °· Stress. °· Menstruation. °· Aged cheeses. °· Foods or drinks that contain nitrates, glutamate, aspartame, or tyramine. °· Lack of sleep. °· Chocolate. °· Caffeine. °· Hunger. °· Physical exertion. °· Fatigue. °· Medicines used to treat chest pain (nitroglycerine), birth control pills, estrogen, and some blood pressure medicines. °SIGNS AND SYMPTOMS °· Pain on one or both sides of your head. °· Pulsating or throbbing pain. °· Severe pain that prevents daily activities. °· Pain that is aggravated by any physical activity. °· Nausea, vomiting, or both. °· Dizziness. °· Pain with exposure to bright lights, loud noises, or activity. °· General sensitivity to bright lights, loud noises, or smells. °Before you get a migraine, you may get warning signs that a migraine is coming (aura). An aura may include: °· Seeing flashing lights. °· Seeing bright spots, halos, or zigzag lines. °· Having tunnel vision or blurred vision. °· Having feelings of numbness or tingling. °· Having trouble talking. °· Having muscle weakness. °DIAGNOSIS  °A migraine headache is often diagnosed based on: °· Symptoms. °· Physical exam. °· A CT scan or MRI of your head. These imaging tests cannot diagnose migraines, but they can help rule out other causes of headaches. °TREATMENT °Medicines may be given for pain and nausea.  Medicines can also be given to help prevent recurrent migraines.  °HOME CARE INSTRUCTIONS °· Only take over-the-counter or prescription medicines for pain or discomfort as directed by your health care provider. The use of long-term narcotics is not recommended. °· Lie down in a dark, quiet room when you have a migraine. °· Keep a journal to find out what may trigger your migraine headaches. For example, write down: °¨ What you eat and drink. °¨ How much sleep you get. °¨ Any change to your diet or medicines. °· Limit alcohol consumption. °· Quit smoking if you smoke. °· Get 7-9 hours of sleep, or as recommended by your health care provider. °· Limit stress. °· Keep lights dim if bright lights bother you and make your migraines worse. °SEEK IMMEDIATE MEDICAL CARE IF:  °· Your migraine becomes severe. °· You have a fever. °· You have a stiff neck. °· You have vision loss. °· You have muscular weakness or loss of muscle control. °· You start losing your balance or have trouble walking. °· You feel faint or pass out. °· You have severe symptoms that are different from your first symptoms. °MAKE SURE YOU:  °· Understand these instructions. °· Will watch your condition. °· Will get help right away if you are not doing well or get worse. °  °This information is not intended to replace advice given to you by your health care provider. Make sure you discuss any questions you have with your health care provider. °  °Document Released: 01/29/2005 Document Revised: 02/19/2014 Document Reviewed: 10/06/2012 °Elsevier Interactive Patient Education ©2016 [employer] ° °

## 2015-07-28 NOTE — ED Notes (Signed)
Assisted pt up to BR

## 2015-07-28 NOTE — ED Notes (Signed)
Goodman to room at this time

## 2015-07-28 NOTE — ED Notes (Addendum)
Patient presents to ED with c/o right sided headache for a day, states "its the worst migraine ever" and high blood pressure for about a day. Pt was seen by neurologist yesterday for migraine and prescribed Nortriptyline, pt reports has not taken it, reports wanted to get approval from cardiologist first. Patient reports neck stiffness and a knot to right side of head. Patient also c/o blurry vision and nausea.  Patient denies chest pain or shortness of breath. Patient has hx of migraines. Patient alert and oriented x 4.

## 2015-07-29 NOTE — Telephone Encounter (Signed)
S/w pt who reports she was in the ED yesterday with a migraine. Her BP was elevated at that time.  Reports feeling better today. Reviewed Dr. Jari Sportsman recommendations.  Pt states she has not started either lisinopril or nortriptyline yet as she was waiting for his approval. She will take lisinopril this AM and nortriptyline this evening and report if she does not see improvement. Advised pt to call the On Call service this weekend if she has concerns/questions. Pt verbalized understanding with no further questions.

## 2015-08-02 ENCOUNTER — Telehealth: Payer: Self-pay | Admitting: Cardiovascular Disease

## 2015-08-02 ENCOUNTER — Other Ambulatory Visit: Payer: Self-pay

## 2015-08-02 DIAGNOSIS — I10 Essential (primary) hypertension: Secondary | ICD-10-CM

## 2015-08-02 MED ORDER — HYDROCHLOROTHIAZIDE 12.5 MG PO CAPS: 12.5000 mg | ORAL_CAPSULE | Freq: Every day | ORAL | Status: DC

## 2015-08-02 NOTE — Telephone Encounter (Signed)
Stop Lisinopril. Start HCTZ 12.5 mg once daily. Check BMP in 1 week.

## 2015-08-02 NOTE — Telephone Encounter (Signed)
Had bp episode 177/110 last week   Still hovering 160/100 range   Patient states she hasn't had diuretic in a while and thinks this may help.   Please call to discuss.

## 2015-08-02 NOTE — Telephone Encounter (Signed)
S/w pt of Dr. Jari Sportsman recommendations. BMET June 30, 8:40am in our office. Prescription submitted. Pt verbalized understanding and is agreeable w/plan.

## 2015-08-02 NOTE — Telephone Encounter (Signed)
S/w pt who reports continued elevated BP even since the addition of lisinopril 2.5mg  by her neurologist.  She has been taking lisinopril qd and coreg BID since June 17.  Reports today BP 160/100 2 hours after taking morning meds.  Pt states she takes BP qd but is unable to provide readings, only that "it is always high, SBP 160s and DBP 100 but will go down a little bit in the evenings around the 90s" She thinks she is pre-menopausal which is causing migraines. Pt was prescribed nortriptyline 10mg  qd but is only taking it the week before her menstrual cycle. States she thinks she needs a diuretic as she has seen BP improvement when on HCTZ . This has not been prescribed by Dr. Kirke Corin.  Advised pt to continue medications as scheduled as it may take a few days to see improvement. She is agreeable to check BP prior to and an hour after BP meds and report readings as she can only provide one reading today.  Will forward to MD to review and further advise.

## 2015-08-12 ENCOUNTER — Telehealth: Payer: Self-pay | Admitting: Cardiovascular Disease

## 2015-08-12 ENCOUNTER — Other Ambulatory Visit: Payer: BLUE CROSS/BLUE SHIELD

## 2015-08-12 NOTE — Telephone Encounter (Signed)
Called pt to reschedule today's missed lab appt. Left message on work VM. No answer on cell and VM box is full

## 2016-02-13 HISTORY — PX: BOWEL RESECTION: SHX1257

## 2016-03-02 ENCOUNTER — Other Ambulatory Visit
Admission: RE | Admit: 2016-03-02 | Discharge: 2016-03-02 | Disposition: A | Discharge status: Home / Self Care | Payer: BLUE CROSS/BLUE SHIELD | Source: Ambulatory Visit | Attending: Cardiovascular Disease | Admitting: Cardiovascular Disease

## 2016-03-02 ENCOUNTER — Ambulatory Visit
Admission: RE | Admit: 2016-03-02 | Discharge: 2016-03-02 | Disposition: A | Discharge status: Home / Self Care | Payer: BLUE CROSS/BLUE SHIELD | Source: Ambulatory Visit | Attending: Cardiovascular Disease | Admitting: Cardiovascular Disease

## 2016-03-02 ENCOUNTER — Ambulatory Visit (INDEPENDENT_AMBULATORY_CARE_PROVIDER_SITE_OTHER): Payer: BLUE CROSS/BLUE SHIELD | Admitting: Cardiovascular Disease

## 2016-03-02 ENCOUNTER — Telehealth: Payer: Self-pay | Admitting: Cardiovascular Disease

## 2016-03-02 ENCOUNTER — Other Ambulatory Visit: Payer: Self-pay

## 2016-03-02 ENCOUNTER — Encounter: Payer: Self-pay | Admitting: Cardiovascular Disease

## 2016-03-02 VITALS — BP 130/92 | HR 69 | Ht 64.0 in | Wt 130.5 lb

## 2016-03-02 DIAGNOSIS — I10 Essential (primary) hypertension: Secondary | ICD-10-CM

## 2016-03-02 DIAGNOSIS — R0602 Shortness of breath: Secondary | ICD-10-CM | POA: Diagnosis not present

## 2016-03-02 DIAGNOSIS — R079 Chest pain, unspecified: Secondary | ICD-10-CM

## 2016-03-02 DIAGNOSIS — E785 Hyperlipidemia, unspecified: Secondary | ICD-10-CM | POA: Diagnosis not present

## 2016-03-02 LAB — BASIC METABOLIC PANEL
Anion gap: 6 (ref 5–15)
BUN: 10 mg/dL (ref 6–20)
CO2: 27 mmol/L (ref 22–32)
Calcium: 9 mg/dL (ref 8.9–10.3)
Chloride: 105 mmol/L (ref 101–111)
Creatinine, Ser: 0.69 mg/dL (ref 0.44–1.00)
GFR calc Af Amer: >60 mL/min (ref >60)
GFR calc non Af Amer: >60 mL/min (ref >60)
Glucose, Bld: 97 mg/dL (ref 65–99)
Potassium: 3.9 mmol/L (ref 3.5–5.1)
Sodium: 138 mmol/L (ref 135–145)

## 2016-03-02 LAB — CBC
HCT: 35.4 % (ref 35.0–47.0)
Hemoglobin: 11.6 g/dL — ABNORMAL LOW (ref 12.0–16.0)
MCH: 28.1 pg (ref 26.0–34.0)
MCHC: 32.6 g/dL (ref 32.0–36.0)
MCV: 86.2 fL (ref 80.0–100.0)
Platelets: 290 10*3/uL (ref 150–440)
RBC: 4.11 MIL/uL (ref 3.80–5.20)
RDW: 15.7 % — ABNORMAL HIGH (ref 11.5–14.5)
WBC: 4.6 10*3/uL (ref 3.6–11.0)

## 2016-03-02 LAB — TROPONIN I: Troponin I: <0.03 ng/mL (ref <0.03)

## 2016-03-02 LAB — SEDIMENTATION RATE: Sed Rate: 17 mm/hr (ref 0–20)

## 2016-03-02 LAB — FIBRIN DERIVATIVES D-DIMER (ARMC ONLY): Fibrin derivatives D-dimer (ARMC): 431 (ref 0–499)

## 2016-03-02 MED ORDER — LISINOPRIL 5 MG PO TABS: 5.0000 mg | ORAL_TABLET | Freq: Every day | ORAL | 5 refills | Status: DC

## 2016-03-02 NOTE — Telephone Encounter (Signed)
Pt c/o Shortness Of Breath: STAT if SOB developed within the last 24 hours or pt is noticeably SOB on the phone  1. Are you currently SOB (can you hear that pt is SOB on the phone)? Yes struggling to take a deep breath stabbing aching pain over left breast area   2. How long have you been experiencing SOB?  This morning - less bothersome episodes previously   3. Are you SOB when sitting or when up moving around? Sitting and bending over makes it worse   4. Are you currently experiencing any other symptoms?  No   Patient scheduled in open spot with arida for today at 1030 .  She wants to make sure she is safe to wait in office until then.

## 2016-03-02 NOTE — Progress Notes (Signed)
Cardiology Office Note   Date:  03/02/2016   ID:  Chelsea Carroll, DOB 04/07/1975, MRN [id number]  PCP:  Lyndon Code, MD  Cardiologist:   Lorine Bears, MD   Chief Complaint  Patient presents with  . other    Pt. c/o chest pain/pressure & having difficulty taking a deep breath. Meds reviewed by the pt. verbally.       History of Present Illness: Chelsea Carroll is a 41 y.o. female who was admitted For evaluation of chest pain. She has been followed by me for essential hypertension and family history of premature coronary artery disease.   Stress echocardiogram in May 2015 was normal.  Renal artery duplex in September 2015 showed no evidence of renal artery stenosis.  During last visit, losartan was switched to amlodipine due to GI symptoms. She reports that amlodipine made her feel bad and she was placed on small dose lisinopril. She has been tolerating blood pressure medications fine since then. She woke up this morning with left-sided chest pain. The pain is located in the left breast area and described as sharp discomfort which gets worse with lying down and movements. It is also worse with taking a breath and she has not been able to take a good breath. She denies any recent upper respiratory tract infection that she has no symptoms suggestive of that. No cough or fevers.   Past Medical History:  Diagnosis Date  . Hypertension   . Migraines     No past surgical history on file.   Current Outpatient Prescriptions  Medication Sig Dispense Refill  . carvedilol (COREG) 6.25 MG tablet Take 1 tablet (6.25 mg total) by mouth 2 (two) times daily. 60 tablet 11  . lisinopril (PRINIVIL,ZESTRIL) 5 MG tablet Take 1 tablet (5 mg total) by mouth daily. 30 tablet 5   No current facility-administered medications for this visit.     Allergies:   Codeine and Iodine    Social History:  The patient  reports that she has never smoked. She has never used smokeless tobacco. She  reports that she drinks alcohol. She reports that she does not use drugs.   Family History:  The patient's family history includes Hyperlipidemia in her mother; Hypertension in her mother.    ROS:  Please see the history of present illness.   Otherwise, review of systems are positive for none.   All other systems are reviewed and negative.    PHYSICAL EXAM: VS:  BP (!) 130/92 (BP Location: Left Arm, Patient Position: Sitting, Cuff Size: Normal)   Pulse 69   Ht 5\' 4"  (1.626 m)   Wt 130 lb 8 oz (59.2 kg)   BMI 22.40 kg/m  , BMI Body mass index is 22.4 kg/m. GEN: Well nourished, well developed, in no acute distress  HEENT: normal  Neck: no JVD, carotid bruits, or masses Cardiac: RRR; no murmurs, rubs, or gallops,no edema  Respiratory:  clear to auscultation bilaterally, normal work of breathing GI: soft, nontender, nondistended, + BS MS: no deformity or atrophy  Skin: warm and dry, no rash Neuro:  Strength and sensation are intact Psych: euthymic mood, full affect   EKG:  EKG is ordered today. The ekg ordered today demonstrates normal sinus rhythm with no significant ST or T wave changes.      /2017: ALT 14; BUN 9; Creatinine, Ser 0.62; Hemoglobin 12.8; Platelets 241; Potassium 3.1; Sodium 138    Lipid Panel    Component Value Date/Time  CHOL 266 (H) 09/16/2013 0857   TRIG 113 09/16/2013 0857   HDL 83 09/16/2013 0857   CHOLHDL 3.2 09/16/2013 0857   LDLCALC 160 (H) 09/16/2013 0857      Wt Readings from Last 3 Encounters:  03/02/16 130 lb 8 oz (59.2 kg)  07/28/15 134 lb (60.8 kg)  05/27/15 131 lb (59.4 kg)        ASSESSMENT AND PLAN:  1.  Atypical pleuritic chest pain: The quality of the pain is pleuritic with possible musculoskeletal component. The quality is not anginal at all. EKG does not show any acute changes and no evidence of ST deviations suggest pericarditis. Her chest pain has been continuous since this morning. I ordered stat labs that include CBC,  basic metabolic profile, troponin, d-dimer and sedimentation rate. I also ordered a chest x-ray. I will obtain an echocardiogram to ensure no pericardial effusion. If testing comes back unremarkable, she can take a short course of NSAIDs.  2. Essential hypertension: Blood pressure is mildly elevated with intolerance to multiple medications. I increased the dose of lisinopril to 5 mg once daily.  3. Hyperlipidemia: Previous LDL was 160 in 2015. Will need a more updated lipid profile.   3. Family history of coronary artery disease: I suggested CT calcium score once she is around 41 years old to help with risk stratification.    Disposition:   FU with me in 1 month.   Signed,  Lorine Bears, MD  03/02/2016 10:49 AM    [facility]

## 2016-03-02 NOTE — Patient Instructions (Addendum)
Medication Instructions:  Your physician has recommended you make the following change in your medication:  INCREASE lisinopril to 5mg  once daily   Labwork: BMET, CBC, Troponin, d-dimer, sed rate at [facility] lab now  Testing/Procedures: A chest x-ray takes a picture of the organs and structures inside the chest, including the heart, lungs, and blood vessels. This test can show several things, including, whether the heart is enlarges; whether fluid is building up in the lungs; and whether pacemaker / defibrillator leads are still in place. Please have this done now at the [facility].   Your physician has requested that you have an echocardiogram. Echocardiography is a painless test that uses sound waves to create images of your heart. It provides your doctor with information about the size and shape of your heart and how well your heart's chambers and valves are working. This procedure takes approximately one hour. There are no restrictions for this procedure.      Follow-Up: Your physician recommends that you schedule a follow-up appointment in: 1 month with Dr. Kirke Corin.    Any Other Special Instructions Will Be Listed Below (If Applicable).     If you need a refill on your cardiac medications before your next appointment, please call your pharmacy.  Echocardiogram An echocardiogram, or echocardiography, uses sound waves (ultrasound) to produce an image of your heart. The echocardiogram is simple, painless, obtained within a short period of time, and offers valuable information to your health care provider. The images from an echocardiogram can provide information such as:  Evidence of coronary artery disease (CAD).  Heart size.  Heart muscle function.  Heart valve function.  Aneurysm detection.  Evidence of a past heart attack.  Fluid buildup around the heart.  Heart muscle thickening.  Assess heart valve function. Tell a health care provider about:  Any allergies  you have.  All medicines you are taking, including vitamins, herbs, eye drops, creams, and over-the-counter medicines.  Any problems you or family members have had with anesthetic medicines.  Any blood disorders you have.  Any surgeries you have had.  Any medical conditions you have.  Whether you are pregnant or may be pregnant. What happens before the procedure? No special preparation is needed. Eat and drink normally. What happens during the procedure?  In order to produce an image of your heart, gel will be applied to your chest and a wand-like tool (transducer) will be moved over your chest. The gel will help transmit the sound waves from the transducer. The sound waves will harmlessly bounce off your heart to allow the heart images to be captured in real-time motion. These images will then be recorded.  You may need an IV to receive a medicine that improves the quality of the pictures. What happens after the procedure? You may return to your normal schedule including diet, activities, and medicines, unless your health care provider tells you otherwise. This information is not intended to replace advice given to you by your health care provider. Make sure you discuss any questions you have with your health care provider. Document Released: 01/27/2000 Document Revised: 09/17/2015 Document Reviewed: 10/06/2012 Elsevier Interactive Patient Education  2017 [employer].

## 2016-03-02 NOTE — Telephone Encounter (Addendum)
I s/w pt in the waiting area. She states this morning she began experiencing chest discomfort described as a "catching" above her left breast. Rates pain 6/10 when she bends over, lays flat or breathes deeply. Denies any other sx. She has decreased her coreg to 6.25mg  once daily as she feels her HR is low however, she does not take her VS on a regular basis.  She prefers to see Dr. Kirke Corin at 10:30am vs going to the ER. We reviewed sx that would require immediate attention and she verbalized understanding to let the front office staff know if sx worsen before clinic begins.  Pt checked in and brought back to room 10 at 10:08am

## 2016-03-17 ENCOUNTER — Inpatient Hospital Stay
Admission: EM | Admit: 2016-03-17 | Discharge: 2016-03-26 | DRG: 329 | Disposition: A | Discharge status: Home / Self Care | Payer: BLUE CROSS/BLUE SHIELD | Attending: Surgery | Admitting: Surgery

## 2016-03-17 ENCOUNTER — Emergency Department: Payer: BLUE CROSS/BLUE SHIELD | Admitting: Anesthesiology

## 2016-03-17 ENCOUNTER — Encounter
Admission: EM | Disposition: A | Discharge status: Home / Self Care | Payer: Self-pay | Source: Home / Self Care | Attending: Surgery

## 2016-03-17 ENCOUNTER — Emergency Department: Payer: BLUE CROSS/BLUE SHIELD

## 2016-03-17 ENCOUNTER — Encounter: Payer: Self-pay | Admitting: Emergency Medicine

## 2016-03-17 DIAGNOSIS — I81 Portal vein thrombosis: Secondary | ICD-10-CM

## 2016-03-17 DIAGNOSIS — K567 Ileus, unspecified: Secondary | ICD-10-CM | POA: Diagnosis not present

## 2016-03-17 DIAGNOSIS — R1013 Epigastric pain: Secondary | ICD-10-CM | POA: Insufficient documentation

## 2016-03-17 DIAGNOSIS — K55069 Acute infarction of intestine, part and extent unspecified: Secondary | ICD-10-CM | POA: Diagnosis present

## 2016-03-17 DIAGNOSIS — K56609 Unspecified intestinal obstruction, unspecified as to partial versus complete obstruction: Secondary | ICD-10-CM | POA: Diagnosis present

## 2016-03-17 DIAGNOSIS — R140 Abdominal distension (gaseous): Secondary | ICD-10-CM

## 2016-03-17 DIAGNOSIS — K5909 Other constipation: Secondary | ICD-10-CM | POA: Diagnosis present

## 2016-03-17 DIAGNOSIS — K219 Gastro-esophageal reflux disease without esophagitis: Secondary | ICD-10-CM | POA: Diagnosis present

## 2016-03-17 DIAGNOSIS — K561 Intussusception: Principal | ICD-10-CM | POA: Diagnosis present

## 2016-03-17 DIAGNOSIS — K5090 Crohn's disease, unspecified, without complications: Secondary | ICD-10-CM | POA: Diagnosis present

## 2016-03-17 DIAGNOSIS — I10 Essential (primary) hypertension: Secondary | ICD-10-CM | POA: Diagnosis present

## 2016-03-17 DIAGNOSIS — R109 Unspecified abdominal pain: Secondary | ICD-10-CM

## 2016-03-17 HISTORY — PX: LAPAROTOMY: SHX154

## 2016-03-17 HISTORY — DX: Unspecified intestinal obstruction, unspecified as to partial versus complete obstruction: K56.609

## 2016-03-17 LAB — CBC
HCT: 36 % (ref 35.0–47.0)
Hemoglobin: 12.3 g/dL (ref 12.0–16.0)
MCH: 28.3 pg (ref 26.0–34.0)
MCHC: 34.1 g/dL (ref 32.0–36.0)
MCV: 83.1 fL (ref 80.0–100.0)
Platelets: 273 10*3/uL (ref 150–440)
RBC: 4.33 MIL/uL (ref 3.80–5.20)
RDW: 15.4 % — ABNORMAL HIGH (ref 11.5–14.5)
WBC: 9.2 10*3/uL (ref 3.6–11.0)

## 2016-03-17 LAB — COMPREHENSIVE METABOLIC PANEL
ALT: 16 U/L (ref 14–54)
AST: 23 U/L (ref 15–41)
Albumin: 4.5 g/dL (ref 3.5–5.0)
Alkaline Phosphatase: 50 U/L (ref 38–126)
Anion gap: 10 (ref 5–15)
BUN: 14 mg/dL (ref 6–20)
CO2: 22 mmol/L (ref 22–32)
Calcium: 9.3 mg/dL (ref 8.9–10.3)
Chloride: 103 mmol/L (ref 101–111)
Creatinine, Ser: 0.72 mg/dL (ref 0.44–1.00)
GFR calc Af Amer: >60 mL/min (ref >60)
GFR calc non Af Amer: >60 mL/min (ref >60)
Glucose, Bld: 122 mg/dL — ABNORMAL HIGH (ref 65–99)
Potassium: 3.8 mmol/L (ref 3.5–5.1)
Sodium: 135 mmol/L (ref 135–145)
Total Bilirubin: 0.8 mg/dL (ref 0.3–1.2)
Total Protein: 8 g/dL (ref 6.5–8.1)

## 2016-03-17 LAB — URINALYSIS, COMPLETE (UACMP) WITH MICROSCOPIC
Bilirubin Urine: NEGATIVE
Glucose, UA: NEGATIVE mg/dL
Hgb urine dipstick: NEGATIVE
Ketones, ur: 80 mg/dL — AB
Leukocytes, UA: NEGATIVE
Nitrite: NEGATIVE
Protein, ur: 30 mg/dL — AB
Specific Gravity, Urine: 1.031 — ABNORMAL HIGH (ref 1.005–1.030)
pH: 5 (ref 5.0–8.0)

## 2016-03-17 LAB — LIPASE, BLOOD: Lipase: 19 U/L (ref 11–51)

## 2016-03-17 LAB — POCT PREGNANCY, URINE: Preg Test, Ur: NEGATIVE

## 2016-03-17 SURGERY — LAPAROTOMY, EXPLORATORY
Anesthesia: General | Wound class: Contaminated

## 2016-03-17 MED ORDER — KETOROLAC TROMETHAMINE 30 MG/ML IJ SOLN
INTRAMUSCULAR | Status: AC
  Filled 2016-03-17: qty 1

## 2016-03-17 MED ORDER — FENTANYL CITRATE (PF) 100 MCG/2ML IJ SOLN
INTRAMUSCULAR | Status: AC
  Filled 2016-03-17: qty 2

## 2016-03-17 MED ORDER — ACETAMINOPHEN 10 MG/ML IV SOLN
INTRAVENOUS | Status: DC | PRN
  Administered 2016-03-17: 1000 mg via INTRAVENOUS

## 2016-03-17 MED ORDER — ONDANSETRON HCL 4 MG/2ML IJ SOLN
INTRAMUSCULAR | Status: DC | PRN
  Administered 2016-03-17: 4 mg via INTRAVENOUS

## 2016-03-17 MED ORDER — DEXAMETHASONE SODIUM PHOSPHATE 10 MG/ML IJ SOLN
INTRAMUSCULAR | Status: AC
  Filled 2016-03-17: qty 1

## 2016-03-17 MED ORDER — HYDROMORPHONE HCL 1 MG/ML IJ SOLN
INTRAMUSCULAR | Status: AC
  Filled 2016-03-17: qty 1

## 2016-03-17 MED ORDER — LIDOCAINE HCL (CARDIAC) 20 MG/ML IV SOLN
INTRAVENOUS | Status: DC | PRN
  Administered 2016-03-17: 60 mg via INTRAVENOUS

## 2016-03-17 MED ORDER — MIDAZOLAM HCL 5 MG/5ML IJ SOLN
INTRAMUSCULAR | Status: DC | PRN
  Administered 2016-03-17: 2 mg via INTRAVENOUS

## 2016-03-17 MED ORDER — SODIUM CHLORIDE 0.9 % IJ SOLN
INTRAMUSCULAR | Status: AC
  Filled 2016-03-17: qty 50

## 2016-03-17 MED ORDER — HYDROMORPHONE 1 MG/ML IV SOLN
INTRAVENOUS | Status: DC
  Administered 2016-03-17: 20:00:00 via INTRAVENOUS
  Administered 2016-03-18: 2.1 mg via INTRAVENOUS
  Administered 2016-03-18 (×3): 1.8 mg via INTRAVENOUS
  Administered 2016-03-18: 2.7 mg via INTRAVENOUS
  Administered 2016-03-19: 3 mg via INTRAVENOUS
  Administered 2016-03-19: 2.1 mg via INTRAVENOUS
  Administered 2016-03-19: 1.8 mg via INTRAVENOUS
  Administered 2016-03-19: 2.1 mg via INTRAVENOUS
  Administered 2016-03-19: 1.8 mg via INTRAVENOUS
  Administered 2016-03-19: 2.4 mg via INTRAVENOUS
  Administered 2016-03-20: 3.6 mg via INTRAVENOUS
  Administered 2016-03-20: 2.1 mg via INTRAVENOUS
  Filled 2016-03-17 (×3): qty 25

## 2016-03-17 MED ORDER — HYDROMORPHONE HCL 2 MG PO TABS: 4.0000 mg | ORAL_TABLET | Freq: Once | ORAL | Status: DC

## 2016-03-17 MED ORDER — CEFAZOLIN SODIUM 1 G IJ SOLR
INTRAMUSCULAR | Status: DC | PRN
  Administered 2016-03-17: 2 g

## 2016-03-17 MED ORDER — ONDANSETRON HCL 4 MG/2ML IJ SOLN
4.0000 mg | Freq: Four times a day (QID) | INTRAMUSCULAR | Status: DC | PRN
  Administered 2016-03-17 – 2016-03-22 (×15): 4 mg via INTRAVENOUS
  Filled 2016-03-17 (×15): qty 2

## 2016-03-17 MED ORDER — OXYCODONE HCL 5 MG/5ML PO SOLN: 5.0000 mg | Freq: Once | ORAL | Status: DC | PRN

## 2016-03-17 MED ORDER — ONDANSETRON HCL 4 MG PO TABS: 4.0000 mg | ORAL_TABLET | Freq: Four times a day (QID) | ORAL | Status: DC | PRN

## 2016-03-17 MED ORDER — FENTANYL CITRATE (PF) 250 MCG/5ML IJ SOLN
INTRAMUSCULAR | Status: AC
  Filled 2016-03-17: qty 5

## 2016-03-17 MED ORDER — HEPARIN SODIUM (PORCINE) 5000 UNIT/ML IJ SOLN
5000.0000 [IU] | Freq: Three times a day (TID) | INTRAMUSCULAR | Status: DC
  Administered 2016-03-17 – 2016-03-18 (×3): 5000 [IU] via SUBCUTANEOUS
  Filled 2016-03-17 (×3): qty 1

## 2016-03-17 MED ORDER — BUPIVACAINE-EPINEPHRINE (PF) 0.25% -1:200000 IJ SOLN
INTRAMUSCULAR | Status: AC
  Filled 2016-03-17: qty 30

## 2016-03-17 MED ORDER — BUPIVACAINE LIPOSOME 1.3 % IJ SUSP
INTRAMUSCULAR | Status: AC
  Filled 2016-03-17: qty 20

## 2016-03-17 MED ORDER — ACETAMINOPHEN 10 MG/ML IV SOLN
INTRAVENOUS | Status: AC
  Filled 2016-03-17: qty 100

## 2016-03-17 MED ORDER — FENTANYL CITRATE (PF) 100 MCG/2ML IJ SOLN
25.0000 ug | INTRAMUSCULAR | Status: DC | PRN
  Administered 2016-03-17 (×4): 25 ug via INTRAVENOUS

## 2016-03-17 MED ORDER — LORAZEPAM 2 MG/ML IJ SOLN
1.0000 mg | Freq: Once | INTRAMUSCULAR | Status: AC
  Administered 2016-03-17: 1 mg via INTRAVENOUS
  Filled 2016-03-17: qty 1

## 2016-03-17 MED ORDER — SUCCINYLCHOLINE CHLORIDE 200 MG/10ML IV SOSY
PREFILLED_SYRINGE | INTRAVENOUS | Status: AC
Start: 2016-03-17 — End: 2016-03-17
  Filled 2016-03-17: qty 10

## 2016-03-17 MED ORDER — BARIUM SULFATE 2.1 % PO SUSP: 450.0000 mL | ORAL | Status: DC

## 2016-03-17 MED ORDER — SODIUM CHLORIDE 0.9% FLUSH: 9.0000 mL | INTRAVENOUS | Status: DC | PRN

## 2016-03-17 MED ORDER — PROPOFOL 10 MG/ML IV BOLUS
INTRAVENOUS | Status: AC
  Filled 2016-03-17: qty 20

## 2016-03-17 MED ORDER — SODIUM CHLORIDE 0.9 % IV SOLN
INTRAVENOUS | Status: DC | PRN
  Administered 2016-03-17: 60 mL

## 2016-03-17 MED ORDER — PROPOFOL 10 MG/ML IV BOLUS
INTRAVENOUS | Status: DC | PRN
  Administered 2016-03-17: 150 mg via INTRAVENOUS

## 2016-03-17 MED ORDER — SUGAMMADEX SODIUM 200 MG/2ML IV SOLN
INTRAVENOUS | Status: DC | PRN
  Administered 2016-03-17: 120 mg via INTRAVENOUS

## 2016-03-17 MED ORDER — HYDROMORPHONE HCL 1 MG/ML IJ SOLN
1.0000 mg | Freq: Once | INTRAMUSCULAR | Status: AC
  Administered 2016-03-17: 1 mg via INTRAVENOUS
  Filled 2016-03-17: qty 1

## 2016-03-17 MED ORDER — DIPHENHYDRAMINE HCL 50 MG/ML IJ SOLN: 12.5000 mg | Freq: Four times a day (QID) | INTRAMUSCULAR | Status: DC | PRN

## 2016-03-17 MED ORDER — KCL IN DEXTROSE-NACL 10-5-0.45 MEQ/L-%-% IV SOLN
INTRAVENOUS | Status: DC
  Administered 2016-03-17 – 2016-03-19 (×4): via INTRAVENOUS
  Filled 2016-03-17 (×6): qty 1000

## 2016-03-17 MED ORDER — HYDROMORPHONE HCL 1 MG/ML IJ SOLN
0.2500 mg | INTRAMUSCULAR | Status: DC | PRN
  Administered 2016-03-17 (×2): 0.5 mg via INTRAVENOUS

## 2016-03-17 MED ORDER — FENTANYL CITRATE (PF) 100 MCG/2ML IJ SOLN
INTRAMUSCULAR | Status: DC | PRN
  Administered 2016-03-17: 50 ug via INTRAVENOUS
  Administered 2016-03-17 (×2): 100 ug via INTRAVENOUS

## 2016-03-17 MED ORDER — ONDANSETRON 4 MG PO TBDP
4.0000 mg | ORAL_TABLET | Freq: Once | ORAL | Status: AC
  Administered 2016-03-17: 4 mg via ORAL
  Filled 2016-03-17: qty 1

## 2016-03-17 MED ORDER — ROCURONIUM BROMIDE 100 MG/10ML IV SOLN
INTRAVENOUS | Status: DC | PRN
  Administered 2016-03-17: 25 mg via INTRAVENOUS
  Administered 2016-03-17: 5 mg via INTRAVENOUS
  Administered 2016-03-17: 10 mg via INTRAVENOUS

## 2016-03-17 MED ORDER — DIPHENHYDRAMINE HCL 12.5 MG/5ML PO ELIX: 12.5000 mg | ORAL_SOLUTION | Freq: Four times a day (QID) | ORAL | Status: DC | PRN

## 2016-03-17 MED ORDER — MIDAZOLAM HCL 2 MG/2ML IJ SOLN
INTRAMUSCULAR | Status: AC
  Filled 2016-03-17: qty 2

## 2016-03-17 MED ORDER — SUGAMMADEX SODIUM 200 MG/2ML IV SOLN
INTRAVENOUS | Status: AC
  Filled 2016-03-17: qty 2

## 2016-03-17 MED ORDER — METOCLOPRAMIDE HCL 5 MG/ML IJ SOLN
10.0000 mg | Freq: Once | INTRAMUSCULAR | Status: AC
  Administered 2016-03-17: 10 mg via INTRAVENOUS
  Filled 2016-03-17: qty 2

## 2016-03-17 MED ORDER — SODIUM CHLORIDE 0.9 % IV SOLN
INTRAVENOUS | Status: DC | PRN
  Administered 2016-03-17: 15:00:00 via INTRAVENOUS

## 2016-03-17 MED ORDER — CEFAZOLIN SODIUM 1 G IJ SOLR
INTRAMUSCULAR | Status: AC
  Filled 2016-03-17: qty 20

## 2016-03-17 MED ORDER — OXYCODONE HCL 5 MG PO TABS: 5.0000 mg | ORAL_TABLET | Freq: Once | ORAL | Status: DC | PRN

## 2016-03-17 MED ORDER — SUCCINYLCHOLINE CHLORIDE 20 MG/ML IJ SOLN
INTRAMUSCULAR | Status: DC | PRN
  Administered 2016-03-17: 120 mg via INTRAVENOUS

## 2016-03-17 MED ORDER — BUPIVACAINE HCL (PF) 0.25 % IJ SOLN
INTRAMUSCULAR | Status: DC | PRN
  Administered 2016-03-17: 20 mL

## 2016-03-17 MED ORDER — ONDANSETRON HCL 4 MG/2ML IJ SOLN: 4.0000 mg | Freq: Four times a day (QID) | INTRAMUSCULAR | Status: DC | PRN

## 2016-03-17 MED ORDER — LACTATED RINGERS IV SOLN
INTRAVENOUS | Status: DC | PRN
  Administered 2016-03-17: 16:00:00 via INTRAVENOUS

## 2016-03-17 MED ORDER — NALOXONE HCL 0.4 MG/ML IJ SOLN: 0.4000 mg | INTRAMUSCULAR | Status: DC | PRN

## 2016-03-17 MED ORDER — ONDANSETRON HCL 4 MG/2ML IJ SOLN
INTRAMUSCULAR | Status: AC
  Filled 2016-03-17: qty 2

## 2016-03-17 MED ORDER — KETOROLAC TROMETHAMINE 30 MG/ML IJ SOLN
INTRAMUSCULAR | Status: DC | PRN
  Administered 2016-03-17: 30 mg via INTRAVENOUS

## 2016-03-17 MED ORDER — DEXAMETHASONE SODIUM PHOSPHATE 10 MG/ML IJ SOLN
INTRAMUSCULAR | Status: DC | PRN
  Administered 2016-03-17: 10 mg via INTRAVENOUS

## 2016-03-17 SURGICAL SUPPLY — 31 items
CANISTER SUCT 1200ML W/VALVE (MISCELLANEOUS) ×2 IMPLANT
CATH TRAY 16F METER LATEX (MISCELLANEOUS) ×2 IMPLANT
CHLORAPREP W/TINT 26ML (MISCELLANEOUS) ×2 IMPLANT
DRAPE LAPAROTOMY 100X77 ABD (DRAPES) ×2 IMPLANT
DRSG OPSITE POSTOP 4X10 (GAUZE/BANDAGES/DRESSINGS) ×4 IMPLANT
DRSG TELFA 3X8 NADH (GAUZE/BANDAGES/DRESSINGS) ×2 IMPLANT
ELECT REM PT RETURN 9FT ADLT (ELECTROSURGICAL) ×2
ELECTRODE REM PT RTRN 9FT ADLT (ELECTROSURGICAL) ×1 IMPLANT
GAUZE SPONGE 4X4 12PLY STRL (GAUZE/BANDAGES/DRESSINGS) IMPLANT
GLOVE BIO SURGEON STRL SZ8 (GLOVE) ×10 IMPLANT
GOWN STRL REUS W/ TWL LRG LVL3 (GOWN DISPOSABLE) ×2 IMPLANT
GOWN STRL REUS W/TWL LRG LVL3 (GOWN DISPOSABLE) ×2
KIT RM TURNOVER STRD PROC AR (KITS) ×2 IMPLANT
LABEL OR SOLS (LABEL) ×2 IMPLANT
NDL SAFETY 22GX1.5 (NEEDLE) ×2 IMPLANT
NS IRRIG 1000ML POUR BTL (IV SOLUTION) ×2 IMPLANT
PACK BASIN MAJOR ARMC (MISCELLANEOUS) ×2 IMPLANT
PACK COLON CLEAN CLOSURE (MISCELLANEOUS) IMPLANT
RELOAD PROXIMATE 75MM BLUE (ENDOMECHANICALS) ×4 IMPLANT
SEPRAFILM MEMBRANE 5X6 (MISCELLANEOUS) ×2 IMPLANT
STAPLER PROXIMATE 75MM BLUE (STAPLE) ×2 IMPLANT
STAPLER SKIN PROX 35W (STAPLE) ×2 IMPLANT
SUT PDS AB 1 TP1 54 (SUTURE) ×4 IMPLANT
SUT PROLENE 0 CT 1 30 (SUTURE) ×6 IMPLANT
SUT SILK 3-0 (SUTURE) ×14 IMPLANT
SUT VIC AB 3-0 SH 27 (SUTURE) ×1
SUT VIC AB 3-0 SH 27X BRD (SUTURE) ×1 IMPLANT
SUT VICRYL 2 0 18  UND BR (SUTURE) ×1
SUT VICRYL 2 0 18 UND BR (SUTURE) ×1 IMPLANT
SYR 30ML LL (SYRINGE) ×4 IMPLANT
SYRINGE 10CC LL (SYRINGE) ×2 IMPLANT

## 2016-03-17 NOTE — H&P (Signed)
Chelsea Carroll is an 41 y.o. female.    Chief Complaint: Abdominal pain  HPI: This patient with abdominal pain that started yesterday approximately 1300 hrs. at work. It is been gradually worsening she states it is cramping and started in the epigastrium but is now in the lower quadrants. She is vomited multiple times she is not passing gas and not had a bowel movement.  She states she's had this happened several months ago and it went away after several hours but this time it is unrelenting. She was told at age 60 or 37 that she may have Crohn's disease for similar symptoms.  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 Hypertension   . Migraines     History reviewed. No pertinent surgical history.  Family History  Problem Relation Age of Onset  . Hyperlipidemia Mother   . Hypertension Mother    Social History:  reports that she has never smoked. She has never used smokeless tobacco. She reports that she drinks alcohol. She reports that she does not use drugs.  Allergies:  Allergies  Allergen Reactions  . Codeine     Other reaction(s): Unknown  . Iodine Hives     (Not in a hospital admission)   Review of Systems  Constitutional: Negative for chills and fever.  HENT: Negative.   Eyes: Negative.   Respiratory: Negative.   Cardiovascular: Negative.   Gastrointestinal: Positive for abdominal pain, nausea and vomiting. Negative for blood in stool, constipation, diarrhea, heartburn and melena.  Genitourinary: Negative.   Musculoskeletal: Negative.   Skin: Negative.   Neurological: Negative.   Endo/Heme/Allergies: Negative.   Psychiatric/Behavioral: Negative.      Physical Exam:  BP (!) 141/98   Pulse 81   Temp 98.6 F (37 C) (Oral)   Resp 16   Ht _0  (1.575 m)   Wt 130 lb (59 kg)   LMP 02/22/2016 (Approximate)   SpO2 97%   BMI 23.78  kg/m   Physical Exam  Constitutional: She is oriented to person, place, and time. She appears distressed.  Appears very uncomfortable and tearful  HENT:  Head: Normocephalic and atraumatic.  Eyes: Pupils are equal, round, and reactive to light. Right eye exhibits no discharge. Left eye exhibits no discharge. No scleral icterus.  Cardiovascular: Normal rate, regular rhythm and normal heart sounds.   Tachycardic  Pulmonary/Chest: Effort normal. No respiratory distress. She has no wheezes. She has no rales.  Abdominal: She exhibits distension. There is tenderness. There is guarding. There is no rebound.  Diffuse tenderness mostly in the both lower quadrants with distention and tympany and percussion tenderness with some guarding no rebound.  Musculoskeletal: Normal range of motion. She exhibits no edema or tenderness.  Neurological: She is alert and oriented to person, place, and time.  Skin: Skin is warm and dry. No rash noted. She is not diaphoretic. No erythema.  Psychiatric: Mood and affect normal.  Appears tearful  Vitals reviewed.       Results for orders placed or performed during the hospital encounter of 03/17/16 (from the past 48 hour(s))  Lipase, blood     Status: None   Collection Time: 03/17/16  8:53 AM  Result Value Ref Range   Lipase 19 11 - 51 U/L  Comprehensive metabolic panel     Status: Abnormal   Collection Time: 03/17/16  8:53 AM  Result Value Ref Range   Sodium 135 135 - 145 mmol/L   Potassium 3.8 3.5 - 5.1 mmol/L   Chloride 103 101 - 111 mmol/L   CO2 22 22 - 32 mmol/L   Glucose, Bld 122 (H) 65 - 99 mg/dL   BUN 14 6 - 20 mg/dL   Creatinine, Ser 0.72 0.44 - 1.00 mg/dL   Calcium 9.3 8.9 - 10.3 mg/dL   Total Protein 8.0 6.5 - 8.1 g/dL   Albumin 4.5 3.5 - 5.0 g/dL   AST 23 15 - 41 U/L   ALT 16 14 - 54 U/L   Alkaline Phosphatase 50 38 - 126 U/L   Total Bilirubin 0.8 0.3 - 1.2 mg/dL   GFR calc non Af Amer >60 >60 mL/min   GFR calc Af Amer >60 >60 mL/min     Comment: (NOTE) The eGFR has been calculated using the CKD EPI equation. This calculation has not been validated in all clinical situations. eGFR's persistently <60 mL/min signify possible Chronic Kidney Disease.    Anion gap 10 5 - 15  CBC     Status: Abnormal   Collection Time: 03/17/16  8:53 AM  Result Value Ref Range   WBC 9.2 3.6 - 11.0 K/uL   RBC 4.33 3.80 - 5.20 MIL/uL   Hemoglobin 12.3 12.0 - 16.0 g/dL   HCT 36.0 35.0 - 47.0 %   MCV 83.1 80.0 - 100.0 fL   MCH 28.3 26.0 - 34.0 pg   MCHC 34.1 32.0 - 36.0 g/dL   RDW 15.4 (H) 11.5 - 14.5 %   Platelets 273 150 - 440 K/uL  Pregnancy, urine POC     Status: None   Collection Time: 03/17/16  1:03 PM  Result Value Ref Range   Preg Test, Ur NEGATIVE NEGATIVE    Comment:        THE SENSITIVITY OF THIS METHODOLOGY IS >24 mIU/mL    Ct Abdomen Pelvis Wo Contrast  Result Date: 03/17/2016 CLINICAL DATA:  41 year old female with intermittent epigastric, abdominal and pelvic pain since yesterday. Initial encounter. EXAM: CT ABDOMEN AND PELVIS WITHOUT CONTRAST TECHNIQUE: Multidetector CT imaging of the abdomen and pelvis was performed following the standard protocol without IV contrast. COMPARISON:  Noncontrast CT Abdomen and Pelvis 10/16/2007. Right upper quadrant ultrasound 04/27/2015. FINDINGS: Lower chest: Minor atelectasis or scarring in the medial segment of the right middle lobe, otherwise negative. No pericardial or pleural effusion. Hepatobiliary: Negative noncontrast liver. The gallbladder is mildly distended but otherwise negative. Pancreas: Negative. Spleen: Negative. No abdominal free air or free fluid. Adrenals/Urinary Tract: Negative adrenal glands. Negative noncontrast kidneys. No hydroureter. Stomach/Bowel: Negative rectosigmoid colon aside from some retained stool. Moderate volume of retained stool throughout the left colon, transverse and right colon. Normal appendix (coronal image 30). The terminal ileum is decompressed  (series 2, image 58), but just upstream beginning in the pelvis there are dilated fluid-filled small bowel loops throughout the remaining abdomen. There a fairly discrete transition point on series 5, image 38, in there appears to be a small bowel to small bowel intussusception at this transition. No discrete small bowel mass. No discrete Meckel diverticulum. The leading edge of oral contrast has almost reached the dilated pelvic loops. Small bowel loops measure up to 30 mm diameter, and the most distal dilated loops appear indistinct with mild mesenteric stranding (coronal image 19). The proximal jejunum is normal. The duodenum and stomach are normal. Vascular/Lymphatic: No intravenous contrast. Vascular patency is not evaluated  in the absence of IV contrast. No lymphadenopathy. Reproductive: Negative. Other: Small volume pelvic free fluid, mostly in the cul-de-sac. Musculoskeletal: No acute osseous abnormality identified. IMPRESSION: 1. Acute Small Bowel Obstruction with transition point in the pelvic small bowel where there appears to be an Enteroenteric Intussusception. Adult intussusception is usually due to a pathologically lead point. In this area of distal small bowel consider a Meckel's diverticulum or occult small bowel mass. 2. Mildly inflamed appearing distal loops of obstruct the small bowel. Trace pelvic free fluid. No abdominal free fluid. No free air. Electronically Signed   By: Genevie Ann M.D.   On: 03/17/2016 13:17     Assessment/Plan  CT scan is personally reviewed showing signs of intussusception of the small bowel in the right lower quadrant.  Other labs are reviewed personally.  This a patient with a history of being told that she had Crohn's disease at age 19 or 8 and then a recent episode very similar to this several months ago and now and unrelenting 24 hours of cramping abdominal pain which is now diffuse causing extensive nausea and vomiting. Her exam and CT scan are consistent  with each other is suggestive of intussusception with an acute abdomen. Patient requires urgent exploration  The rationale for offering surgery was discussed the options of observation with nasogastric suction was reviewed and the risks of bleeding infection bowel resection and anastomotic leak and recurrence were all reviewed with her and her husband they understood and agreed to proceed  Florene Glen, MD, FACS

## 2016-03-17 NOTE — Anesthesia Post-op Follow-up Note (Cosign Needed)
Anesthesia QCDR form completed.        

## 2016-03-17 NOTE — Anesthesia Procedure Notes (Signed)
Procedure Name: Intubation Date/Time: 03/17/2016 3:06 PM Performed by: Dionne Bucy Pre-anesthesia Checklist: Patient identified, Patient being monitored, Timeout performed, Emergency Drugs available and Suction available Patient Re-evaluated:Patient Re-evaluated prior to inductionOxygen Delivery Method: Circle system utilized Preoxygenation: Pre-oxygenation with 100% oxygen Intubation Type: IV induction, Rapid sequence and Cricoid Pressure applied Laryngoscope Size: Mac and 3 Grade View: Grade I Tube type: Oral Tube size: 7.0 mm Number of attempts: 2 Airway Equipment and Method: Stylet Placement Confirmation: ETT inserted through vocal cords under direct vision,  positive ETCO2 and breath sounds checked- equal and bilateral Secured at: 21 cm Tube secured with: Tape Dental Injury: Teeth and Oropharynx as per pre-operative assessment

## 2016-03-17 NOTE — ED Notes (Signed)
Pt transported to or

## 2016-03-17 NOTE — ED Triage Notes (Signed)
Abdominal pain and nausea and vomiting since yesterday.  

## 2016-03-17 NOTE — Op Note (Signed)
Pre-operative Diagnosis: Small bowel obstruction, intussusception  Post-operative Diagnosis: Small bowel obstruction  Surgeon: Dionne Milo   Assistants: Surgical tech  Procedure: Right colon resection with terminal ileum resection  Anesthesia: Gen. with endotracheal tube   Procedure Details  The patient was seen again in the Holding Room. The benefits, complications, treatment options, and expected outcomes were discussed with the patient. The risks of bleeding, infection, recurrence of symptoms, failure to resolve symptoms,  bowel injury, any of which could require further surgery were reviewed with the patient.   The patient was taken to Operating Room, identified as Chelsea Carroll and the procedure verified.  A Time Out was held and the above information confirmed.  Prior to the induction of general anesthesia, antibiotic prophylaxis was administered. VTE prophylaxis was in place. General endotracheal anesthesia was then administered and tolerated well. After the induction, the abdomen was prepped with Chloraprep and draped in the sterile fashion. The patient was positioned in the supine position.  A Foley catheter was placed and a nasogastric tube was placed  Exploratory laparotomy was performed by providing an adequate midline incision dissection down to the fascia was performed and the fascia was opened. The abdominal cavity was explored. Very dilated bowel was brought into the wound and run down towards the ligament of Treitz where a large amount of collapsed bowel was identified. Taking bowel the caudad direction the distended bowel was noted to enter a very large scar verified mass which could have been much like a intussusception seen on CT scan. The redundant sigmoid colon was attached densely to this mass. The appendix was identified going into the middle of this mass. The majority of the mass was made up of the terminal ileum and an entire loop of small bowel. Sharp  dissection was performed to attempt to remove the sigmoid colon from this mass. In so doing a very vascularized section of adhesions were identified and as these were cut they were found to contain arterial vessels which required figure-of-eight 3-0 silk ligatures to control them. Were not in typical areas of vascularization and were obviously scar verified vessels and response to the scar in the area.  Once the sigmoid was taken down and no sign of injury was identified attention was turned to the right colon. The terminal ileum was divided proximal to the scar 5 mass of small bowel with a GIA stapler. A GIA was utilized to divide the small bowel near the terminal ileum and the ileocecal valve in order to elevate the infant's involved segment of bowel into the wound. The mesentery was divided between clamps and tied with 0 silk ligature the specimen was inspected and sent off for examination but not opened.  The appendix was removed by dividing the base of the appendix and tying with 2-0 Vicryl and inverting the stump. The mesentery was divided between clamps and tied with 0 silk ligature as well.  The abdominal cavity was further explored and no other pathology was noted.  A side-to-side anastomosis of the ileum to the ileocecal valve area was contemplated. It was noted however that with the heavy vascularization noted previously there was a hematoma in the base of the cecum and the remaining terminal ileum measured only approximately 6 cm. There was some signs of ischemic changes to the terminal ileum staple line as well. With this in mind it was decided to perform a right colon resection due to the ischemic changes in this area and to the difficult vascularization process  that had been identified.  The avascular line laterally was taken down with electrocautery and sharp dissection the followed by blunt dissection and then a site was chosen on the hepatic flexure to divide with the GIA stapling device.  The mesentery was divided between clamps with 0 silk ligature and the specimen was sent off for examination.  At this point a side-to-side anastomosis was performed with handsewn 2 layer technique as follows. The outer layer was a 3-0 silk interrupted Lembert type suture with an inner layer of running an L-type 3-0 Monocryl suture. Once this was complete and found to be palpably patent the mesentery was closed with figure-of-eight 3-0 silk stay prevent internal hernia. NG tube placement was confirmed in the stomach.  Abdominal cavity was entered was irrigated WITH normal saline and hemostasis was adequate. He was infiltrated in skin and subcutaneous tissues tissues and then a piece of Seprafilm was placed followed by running #1 PDS sponge lap needle count was correct. X Bell was placed into the subcutaneous tissues tissues well and then skin staples were placed followed by sterile dressing.  Lastly blood loss was minimal and sponge lap needle count was correct. Patient was taken the recovery room in stable condition to be admitted for continued care.  As an adequate coil note I believe that this likely came about due to a previously ruptured appendix possibly at the time that the patient was told that she might have Crohn's disease at age 41 or 41. This is surmised as the appendix seemed to be tied up in the middle of this scar verified mass.    Findings: Large scar 5 mass of bowel in the right lower quadrant involving a portion of the sigmoid colon right colon and ileum and appendix.  Extensive constipation with inspissated bowel to the cecum. Very hard bowel on tenths in the left colon.   Estimated Blood Loss: Minimal         Drains: None         Specimens: #1 ileum with scar verified mass, #2 appendix #3 right colon        Complications: None                  Condition: Stable   Brendalee Matthies E. Excell Seltzer, MD, FACS

## 2016-03-17 NOTE — ED Notes (Signed)
Pt unable to urinate at this time.  

## 2016-03-17 NOTE — ED Notes (Signed)
Attempted ng - not in right placement per er doc - ng d/c'd in ed

## 2016-03-17 NOTE — ED Notes (Signed)
Pt reports immediate relief with meds, appears more comfortable

## 2016-03-17 NOTE — Transfer of Care (Signed)
Immediate Anesthesia Transfer of Care Note  Patient: Chelsea Carroll  Procedure(s) Performed: Procedure(s): EXPLORATORY LAPAROTOMY,right colon resection.terminal resection,appendectomy (N/A)  Patient Location: PACU  Anesthesia Type:General  Level of Consciousness: sedated  Airway & Oxygen Therapy: Patient Spontanous Breathing and Patient connected to nasal cannula oxygen  Post-op Assessment: Report given to RN and Post -op Vital signs reviewed and stable  Post vital signs: Reviewed and stable  Last Vitals:  Vitals:   03/17/16 1345 03/17/16 1400  BP: 134/88 (!) 136/100  Pulse: (!) 120 (!) 112  Resp: (!) 22 20  Temp:      Last Pain:  Vitals:   03/17/16 1014  TempSrc:   PainSc: 1          Complications: No apparent anesthesia complications

## 2016-03-17 NOTE — ED Provider Notes (Signed)
Time Seen: Approximately 0855 I have reviewed the triage notes  Chief Complaint: Abdominal Pain   History of Present Illness: Chelsea Carroll is a 41 y.o. female who presents with acute onset of right upper quadrant abdominal pain with nausea and vomiting. She states her symptoms started last night and she's had continuous nausea with waves of vomiting. No hematemesis with obvious biliary emesis. Last bowel movement was yesterday and seemed to be normal but she hasn't felt well for the last 48 hours. Denies any back or flank pain. She denies any lower abdominal discomfort.   Past Medical History:  Diagnosis Date  . Hypertension   . Migraines     Patient Active Problem List   Diagnosis Date Noted  . Epigastric pain   . Intussusception intestine (HCC)   . Heart palpitations 06/19/2013  . Chest pain 06/19/2013  . HTN (hypertension) 06/19/2013  . Hyperlipidemia 06/19/2013    History reviewed. No pertinent surgical history.  History reviewed. No pertinent surgical history.  Current Outpatient Rx  . Order #: [id number] Class: Normal  . Order #: [id number] Class: Historical Med  . Order #: [id number] Class: Normal  . Order #: [id number] Class: Historical Med  . Order #: [id number] Class: Historical Med    Allergies:  Codeine and Iodine  Family History: Family History  Problem Relation Age of Onset  . Hyperlipidemia Mother   . Hypertension Mother     Social History: Social History  Substance Use Topics  . Smoking status: Never Smoker  . Smokeless tobacco: Never Used  . Alcohol use Yes     Comment: occasional     Review of Systems:   10 point review of systems was performed and was otherwise negative:  Constitutional: No fever Eyes: No visual disturbances ENT: No sore throat, ear pain Cardiac: No chest pain Respiratory: No shortness of breath, wheezing, or stridor Abdomen: Patient points primarily to the right upper quadrant of her abdomen is a source of  discomfort. She has persistent nausea and vomiting. No loose stool or diarrhea Endocrine: No weight loss, No night sweats Extremities: No peripheral edema, cyanosis Skin: No rashes, easy bruising Neurologic: No focal weakness, trouble with speech or swollowing Urologic: No dysuria, Hematuria, or urinary frequency   Physical Exam:  ED Triage Vitals  Enc Vitals Group     BP 03/17/16 0829 (!) 153/104     Pulse Rate 03/17/16 0829 (!) 110     Resp 03/17/16 0829 20     Temp 03/17/16 0829 98.6 F (37 C)     Temp Source 03/17/16 0829 Oral     SpO2 03/17/16 0829 96 %     Weight 03/17/16 0830 130 lb (59 kg)     Height 03/17/16 0830 5\' 2"  (1.575 m)     Head Circumference --      Peak Flow --      Pain Score 03/17/16 0831 10     Pain Loc --      Pain Edu? --      Excl. in GC? --     General: Awake , Alert , and Oriented times 3; GCS 15 Head: Normal cephalic , atraumatic Eyes: Pupils equal , round, reactive to light Nose/Throat: No nasal drainage, patent upper airway without erythema or exudate.  Neck: Supple, Full range of motion, No anterior adenopathy or palpable thyroid masses Lungs: Clear to ascultation without wheezes , rhonchi, or rales Heart: Regular rate, regular rhythm without murmurs , gallops , or rubs Abdomen: Patient's tenderness over the  right upper quadrant some mild guarding without rebound or rigidity noted. Bowel sounds are difficult to auscultate but remained positive especially in the lower abdominal region. No focal tenderness over McBurney's point        Extremities: 2 plus symmetric pulses. No edema, clubbing or cyanosis Neurologic: normal ambulation, Motor symmetric without deficits, sensory intact Skin: warm, dry, no rashes   Labs:   All laboratory work was reviewed including any pertinent negatives or positives listed below:  Labs Reviewed  COMPREHENSIVE METABOLIC PANEL - Abnormal; Notable for the following:       Result Value   Glucose, Bld 122 (*)     All other components within normal limits  CBC - Abnormal; Notable for the following:    RDW 15.4 (*)    All other components within normal limits  URINALYSIS, COMPLETE (UACMP) WITH MICROSCOPIC - Abnormal; Notable for the following:    Color, Urine YELLOW (*)    APPearance CLEAR (*)    Specific Gravity, Urine 1.031 (*)    Ketones, ur 80 (*)    Protein, ur 30 (*)    Bacteria, UA RARE (*)    Squamous Epithelial / LPF 0-5 (*)    All other components within normal limits  LIPASE, BLOOD  POCT PREGNANCY, URINE  POC URINE PREG, ED  Laboratory work was reviewed and showed no clinically significant abnormalities. Ketones are elevated likely due to dehydration with a high specific gravity of the urine   Radiology:  "Ct Abdomen Pelvis Wo Contrast  Result Date: 03/17/2016 CLINICAL DATA:  41 year old female with intermittent epigastric, abdominal and pelvic pain since yesterday. Initial encounter. EXAM: CT ABDOMEN AND PELVIS WITHOUT CONTRAST TECHNIQUE: Multidetector CT imaging of the abdomen and pelvis was performed following the standard protocol without IV contrast. COMPARISON:  Noncontrast CT Abdomen and Pelvis 10/16/2007. Right upper quadrant ultrasound 04/27/2015. FINDINGS: Lower chest: Minor atelectasis or scarring in the medial segment of the right middle lobe, otherwise negative. No pericardial or pleural effusion. Hepatobiliary: Negative noncontrast liver. The gallbladder is mildly distended but otherwise negative. Pancreas: Negative. Spleen: Negative. No abdominal free air or free fluid. Adrenals/Urinary Tract: Negative adrenal glands. Negative noncontrast kidneys. No hydroureter. Stomach/Bowel: Negative rectosigmoid colon aside from some retained stool. Moderate volume of retained stool throughout the left colon, transverse and right colon. Normal appendix (coronal image 30). The terminal ileum is decompressed (series 2, image 58), but just upstream beginning in the pelvis there are dilated  fluid-filled small bowel loops throughout the remaining abdomen. There a fairly discrete transition point on series 5, image 38, in there appears to be a small bowel to small bowel intussusception at this transition. No discrete small bowel mass. No discrete Meckel diverticulum. The leading edge of oral contrast has almost reached the dilated pelvic loops. Small bowel loops measure up to 30 mm diameter, and the most distal dilated loops appear indistinct with mild mesenteric stranding (coronal image 19). The proximal jejunum is normal. The duodenum and stomach are normal. Vascular/Lymphatic: No intravenous contrast. Vascular patency is not evaluated in the absence of IV contrast. No lymphadenopathy. Reproductive: Negative. Other: Small volume pelvic free fluid, mostly in the cul-de-sac. Musculoskeletal: No acute osseous abnormality identified. IMPRESSION: 1. Acute Small Bowel Obstruction with transition point in the pelvic small bowel where there appears to be an Enteroenteric Intussusception. Adult intussusception is usually due to a pathologically lead point. In this area of distal small bowel consider a Meckel's diverticulum or occult small bowel mass. 2. Mildly inflamed appearing distal  loops of obstruct the small bowel. Trace pelvic free fluid. No abdominal free fluid. No free air. Electronically Signed   By: Odessa Fleming M.D.   On: 03/17/2016 13:17   Dg Chest 2 View  Result Date: 03/02/2016 CLINICAL DATA:  Chest pain EXAM: CHEST  2 VIEW COMPARISON:  December 25, 2013 FINDINGS: Lungs are clear. Heart size and pulmonary vascularity are normal. No adenopathy. No pneumothorax. There is upper thoracic levoscoliosis. IMPRESSION: No edema or consolidation. Electronically Signed   By: Bretta Bang III M.D.   On: 03/02/2016 11:21  "  I personally reviewed the radiologic studies    ED Course:  Patient's stay here showed little improvement with IV pain meds and anti-medic therapy. Her CAT scan evaluation the  patient seems to have an intussusception. Patient's case was reviewed with general surgery Dr. Excell Seltzer plans on taking the patient to the operating room.     Assessment:  Adult intussusception     Plan:  Surgical management           Jennye Moccasin, MD 03/17/16 1431

## 2016-03-17 NOTE — Anesthesia Preprocedure Evaluation (Signed)
Anesthesia Evaluation  Patient identified by MRN, date of birth, ID band Patient awake    Reviewed: Allergy & Precautions, H&P , NPO status , Patient's Chart, lab work & pertinent test results  Airway Mallampati: II  TM Distance: >3 FB Neck ROM: full    Dental  (+) Teeth Intact   Pulmonary neg shortness of breath, pneumonia, resolved,    Pulmonary exam normal breath sounds clear to auscultation       Cardiovascular Exercise Tolerance: Good hypertension, (-) angina(-) Past MI and (-) DOE Normal cardiovascular exam Rhythm:regular Rate:Normal     Neuro/Psych  Headaches, negative psych ROS   GI/Hepatic Neg liver ROS, GERD  Controlled,  Endo/Other  negative endocrine ROS  Renal/GU      Musculoskeletal   Abdominal   Peds  Hematology negative hematology ROS (+)   Anesthesia Other Findings Past Medical History: No date: Hypertension No date: Migraines  History reviewed. No pertinent surgical history.  BMI    Body Mass Index:  23.78 kg/m      Reproductive/Obstetrics negative OB ROS                             Anesthesia Physical Anesthesia Plan  ASA: III  Anesthesia Plan: General ETT   Post-op Pain Management:    Induction:   Airway Management Planned:   Additional Equipment:   Intra-op Plan:   Post-operative Plan:   Informed Consent: I have reviewed the patients History and Physical, chart, labs and discussed the procedure including the risks, benefits and alternatives for the proposed anesthesia with the patient or authorized representative who has indicated his/her understanding and acceptance.   Dental Advisory Given  Plan Discussed with: Anesthesiologist, CRNA and Surgeon  Anesthesia Plan Comments:         Anesthesia Quick Evaluation

## 2016-03-17 NOTE — Anesthesia Postprocedure Evaluation (Signed)
Anesthesia Post Note  Patient: Chelsea Carroll  Procedure(s) Performed: Procedure(s) (LRB): EXPLORATORY LAPAROTOMY,right colon resection.terminal resection,appendectomy (N/A)  Patient location during evaluation: PACU Anesthesia Type: General Level of consciousness: awake and alert Pain management: pain level controlled Vital Signs Assessment: post-procedure vital signs reviewed and stable Respiratory status: spontaneous breathing, nonlabored ventilation, respiratory function stable and patient connected to nasal cannula oxygen Cardiovascular status: blood pressure returned to baseline and stable Postop Assessment: no signs of nausea or vomiting Anesthetic complications: no     Last Vitals:  Vitals:   03/17/16 1755 03/17/16 1800  BP:    Pulse: (!) 104 94  Resp: 13 16  Temp:      Last Pain:  Vitals:   03/17/16 1800  TempSrc:   PainSc: 7                  Roberts Bon K Akeema Broder

## 2016-03-18 LAB — CBC
HCT: 30.2 % — ABNORMAL LOW (ref 35.0–47.0)
Hemoglobin: 10.3 g/dL — ABNORMAL LOW (ref 12.0–16.0)
MCH: 28.7 pg (ref 26.0–34.0)
MCHC: 34.1 g/dL (ref 32.0–36.0)
MCV: 84 fL (ref 80.0–100.0)
Platelets: 224 10*3/uL (ref 150–440)
RBC: 3.59 MIL/uL — ABNORMAL LOW (ref 3.80–5.20)
RDW: 15.7 % — ABNORMAL HIGH (ref 11.5–14.5)
WBC: 5.4 10*3/uL (ref 3.6–11.0)

## 2016-03-18 LAB — BASIC METABOLIC PANEL
Anion gap: 4 — ABNORMAL LOW (ref 5–15)
BUN: 9 mg/dL (ref 6–20)
CO2: 27 mmol/L (ref 22–32)
Calcium: 8 mg/dL — ABNORMAL LOW (ref 8.9–10.3)
Chloride: 104 mmol/L (ref 101–111)
Creatinine, Ser: 0.62 mg/dL (ref 0.44–1.00)
GFR calc Af Amer: >60 mL/min (ref >60)
GFR calc non Af Amer: >60 mL/min (ref >60)
Glucose, Bld: 149 mg/dL — ABNORMAL HIGH (ref 65–99)
Potassium: 4.1 mmol/L (ref 3.5–5.1)
Sodium: 135 mmol/L (ref 135–145)

## 2016-03-18 MED ORDER — CEFAZOLIN SODIUM-DEXTROSE 2-4 GM/100ML-% IV SOLN: 2.0000 g | INTRAVENOUS | Status: DC

## 2016-03-18 MED ORDER — BISACODYL 10 MG RE SUPP
10.0000 mg | Freq: Every morning | RECTAL | Status: DC
  Administered 2016-03-18 – 2016-03-23 (×6): 10 mg via RECTAL
  Filled 2016-03-18 (×6): qty 1

## 2016-03-18 MED ORDER — PHENOL 1.4 % MT LIQD
1.0000 | OROMUCOSAL | Status: DC | PRN
  Administered 2016-03-21 – 2016-03-24 (×4): 1 via OROMUCOSAL
  Filled 2016-03-18 (×2): qty 177

## 2016-03-18 MED ORDER — FENTANYL CITRATE (PF) 100 MCG/2ML IJ SOLN
INTRAMUSCULAR | Status: AC
  Filled 2016-03-18: qty 2

## 2016-03-18 MED ORDER — HEPARIN SODIUM (PORCINE) 5000 UNIT/ML IJ SOLN
5000.0000 [IU] | Freq: Three times a day (TID) | INTRAMUSCULAR | Status: DC
  Administered 2016-03-18 – 2016-03-23 (×11): 5000 [IU] via SUBCUTANEOUS
  Filled 2016-03-18 (×12): qty 1

## 2016-03-18 NOTE — Progress Notes (Signed)
1 Day Post-Op  Subjective: Patient feels better today has incisional pain and is using her PCA sparingly  Objective: Vital signs in last 24 hours: Temp:  [98.1 F (36.7 C)-99.5 F (37.5 C)] 98.7 F (37.1 C) (02/04 0841) Pulse Rate:  [78-120] 90 (02/04 0841) Resp:  [11-23] 16 (02/04 0841) BP: (114-142)/(78-100) 120/83 (02/04 0841) SpO2:  [93 %-100 %] 100 % (02/04 0841) Last BM Date:  (PTA)  Intake/Output from previous day: 02/03 0701 - 02/04 0700 In: 2702.1 [I.V.:2702.1] Out: 410 [Urine:360; Blood:50] Intake/Output this shift: Total I/O In: 448.8 [I.V.:448.8] Out: -   Physical exam:  Wound is clean no erythema no drainage abdomen is minimally tender slightly distended.  Lab Results: CBC   Recent Labs  03/17/16 0853 03/18/16 0621  WBC 9.2 5.4  HGB 12.3 10.3*  HCT 36.0 30.2*  PLT 273 224   BMET  Recent Labs  03/17/16 0853 03/18/16 0621  NA 135 135  K 3.8 4.1  CL 103 104  CO2 22 27  GLUCOSE 122* 149*  BUN 14 9  CREATININE 0.72 0.62  CALCIUM 9.3 8.0*   PT/INR No results for input(s): LABPROT, INR in the last 72 hours. ABG No results for input(s): PHART, HCO3 in the last 72 hours.  Invalid input(s): PCO2, PO2  Studies/Results: Ct Abdomen Pelvis Wo Contrast  Result Date: 03/17/2016 CLINICAL DATA:  41 year old female with intermittent epigastric, abdominal and pelvic pain since yesterday. Initial encounter. EXAM: CT ABDOMEN AND PELVIS WITHOUT CONTRAST TECHNIQUE: Multidetector CT imaging of the abdomen and pelvis was performed following the standard protocol without IV contrast. COMPARISON:  Noncontrast CT Abdomen and Pelvis 10/16/2007. Right upper quadrant ultrasound 04/27/2015. FINDINGS: Lower chest: Minor atelectasis or scarring in the medial segment of the right middle lobe, otherwise negative. No pericardial or pleural effusion. Hepatobiliary: Negative noncontrast liver. The gallbladder is mildly distended but otherwise negative. Pancreas: Negative.  Spleen: Negative. No abdominal free air or free fluid. Adrenals/Urinary Tract: Negative adrenal glands. Negative noncontrast kidneys. No hydroureter. Stomach/Bowel: Negative rectosigmoid colon aside from some retained stool. Moderate volume of retained stool throughout the left colon, transverse and right colon. Normal appendix (coronal image 30). The terminal ileum is decompressed (series 2, image 58), but just upstream beginning in the pelvis there are dilated fluid-filled small bowel loops throughout the remaining abdomen. There a fairly discrete transition point on series 5, image 38, in there appears to be a small bowel to small bowel intussusception at this transition. No discrete small bowel mass. No discrete Meckel diverticulum. The leading edge of oral contrast has almost reached the dilated pelvic loops. Small bowel loops measure up to 30 mm diameter, and the most distal dilated loops appear indistinct with mild mesenteric stranding (coronal image 19). The proximal jejunum is normal. The duodenum and stomach are normal. Vascular/Lymphatic: No intravenous contrast. Vascular patency is not evaluated in the absence of IV contrast. No lymphadenopathy. Reproductive: Negative. Other: Small volume pelvic free fluid, mostly in the cul-de-sac. Musculoskeletal: No acute osseous abnormality identified. IMPRESSION: 1. Acute Small Bowel Obstruction with transition point in the pelvic small bowel where there appears to be an Enteroenteric Intussusception. Adult intussusception is usually due to a pathologically lead point. In this area of distal small bowel consider a Meckel's diverticulum or occult small bowel mass. 2. Mildly inflamed appearing distal loops of obstruct the small bowel. Trace pelvic free fluid. No abdominal free fluid. No free air. Electronically Signed   By: Odessa Fleming M.D.   On: 03/17/2016 13:17  Dg Chest Portable 1 View  Result Date: 03/17/2016 CLINICAL DATA:  NG tube placement, having difficulty  advancing. Pt being admitted for intussusception; acute onset of right upper quadrant abdominal pain with nausea and vomiting since yesterday. Hx- HTN EXAM: PORTABLE CHEST 1 VIEW COMPARISON:  03/02/2016 FINDINGS: Nasogastric tube has been placed, tip in the right mainstem bronchus. Cardiomediastinal silhouette is normal. No pneumothorax. IMPRESSION: Nasogastric tube in the right mainstem bronchus. Critical Value/emergent results were called by telephone at the time of interpretation on 03/17/2016 at 3:09 pm to Dr. Dionne Milo , who verbally acknowledged these results. The nasogastric tube has been removed and replaced. Electronically Signed   By: Norva Pavlov M.D.   On: 03/17/2016 15:11    Anti-infectives: Anti-infectives    None      Assessment/Plan: s/p Procedure(s): EXPLORATORY LAPAROTOMY,right colon resection.terminal resection,appendectomy   Labs reviewed Patient doing well today postop day 1 from small bowel resection and right colon resection for complex scar verified small bowel obstruction.  She also has a history of long-standing severe constipation. At surgery was noted extensive hard stool all the way to the right colon. I will start Dulcolax suppositories today to try to alleviate some of his constipation and speed up her recovery.  Lattie Haw, MD, FACS  03/18/2016

## 2016-03-18 NOTE — Progress Notes (Signed)
RN removed foley per order. Pt is due to void by 1814 03/18/2016.   Reneta Niehaus [employer]

## 2016-03-18 NOTE — Progress Notes (Signed)
Pt ambulated to the nursing station and back to the room. Pt tolerated ambulation well. Will continue to encourage ambulation.   Chelsea Carroll [employer]

## 2016-03-18 NOTE — Progress Notes (Signed)
Pt states that she is concerned that she has not had a bowel movement since the  Dulcolax suppository was given at 1004 03/18/2016. Dr. Excell Seltzer was notified of pt.'s concern, new orders given that pt can get another Dulcolax suppository if wanted. Pt was updated with Dr. Earmon Phoenix response, pt does not want another suppository at this time since she is schedule for another suppository tomorrow morning at 1000 03/19/2016. Will continue to monitor pt.   Vern Guerette [employer]

## 2016-03-19 ENCOUNTER — Encounter: Payer: Self-pay | Admitting: Surgery

## 2016-03-19 LAB — CBC WITH DIFFERENTIAL/PLATELET
Basophils Absolute: 0 10*3/uL (ref 0–0.1)
Basophils Relative: 0 %
Eosinophils Absolute: 0 10*3/uL (ref 0–0.7)
Eosinophils Relative: 1 %
HCT: 29.8 % — ABNORMAL LOW (ref 35.0–47.0)
Hemoglobin: 10 g/dL — ABNORMAL LOW (ref 12.0–16.0)
Lymphocytes Relative: 16 %
Lymphs Abs: 0.8 10*3/uL — ABNORMAL LOW (ref 1.0–3.6)
MCH: 28.4 pg (ref 26.0–34.0)
MCHC: 33.7 g/dL (ref 32.0–36.0)
MCV: 84.3 fL (ref 80.0–100.0)
Monocytes Absolute: 0.7 10*3/uL (ref 0.2–0.9)
Monocytes Relative: 15 %
Neutro Abs: 3.3 10*3/uL (ref 1.4–6.5)
Neutrophils Relative %: 68 %
Platelets: 222 10*3/uL (ref 150–440)
RBC: 3.54 MIL/uL — ABNORMAL LOW (ref 3.80–5.20)
RDW: 16.2 % — ABNORMAL HIGH (ref 11.5–14.5)
WBC: 4.8 10*3/uL (ref 3.6–11.0)

## 2016-03-19 LAB — BASIC METABOLIC PANEL
Anion gap: 5 (ref 5–15)
BUN: 6 mg/dL (ref 6–20)
CO2: 28 mmol/L (ref 22–32)
Calcium: 8 mg/dL — ABNORMAL LOW (ref 8.9–10.3)
Chloride: 102 mmol/L (ref 101–111)
Creatinine, Ser: 0.59 mg/dL (ref 0.44–1.00)
GFR calc Af Amer: >60 mL/min (ref >60)
GFR calc non Af Amer: >60 mL/min (ref >60)
Glucose, Bld: 124 mg/dL — ABNORMAL HIGH (ref 65–99)
Potassium: 3.4 mmol/L — ABNORMAL LOW (ref 3.5–5.1)
Sodium: 135 mmol/L (ref 135–145)

## 2016-03-19 MED ORDER — KETOROLAC TROMETHAMINE 30 MG/ML IJ SOLN
30.0000 mg | Freq: Four times a day (QID) | INTRAMUSCULAR | Status: DC
  Filled 2016-03-19 (×2): qty 1

## 2016-03-19 MED ORDER — KCL IN DEXTROSE-NACL 20-5-0.45 MEQ/L-%-% IV SOLN
INTRAVENOUS | Status: DC
  Administered 2016-03-19 (×2): via INTRAVENOUS
  Administered 2016-03-20: 1000 mL via INTRAVENOUS
  Administered 2016-03-21 – 2016-03-26 (×10): via INTRAVENOUS
  Filled 2016-03-19 (×16): qty 1000

## 2016-03-19 NOTE — Progress Notes (Signed)
03/19/2016  Subjective: Patient is 2 Days Post-Op s/p exploratory laparotomy with right hemicolectomy.  Reports that she's still having abdomina pain and some nausea.  No flatus yet.  Vital signs: Temp:  [98.5 F (36.9 C)-99.1 F (37.3 C)] 98.6 F (37 C) (02/05 0820) Pulse Rate:  [83-93] 89 (02/05 0820) Resp:  [1-18] 15 (02/05 0820) BP: (110-123)/(64-80) 123/80 (02/05 0820) SpO2:  [100 %] 100 % (02/05 0820)   Intake/Output: 02/04 0701 - 02/05 0700 In: 2965.7 [I.V.:2965.7] Out: 550 [Urine:350; Emesis/NG output:200] Last BM Date:  (PTA)  Physical Exam: Constitutional: No acute distress Abdomen: soft, nondistended, appropriately tender to palpation.  Incision with dressing in place.  Labs:   Recent Labs  03/18/16 0621 03/19/16 0518  WBC 5.4 4.8  HGB 10.3* 10.0*  HCT 30.2* 29.8*  PLT 224 222    Recent Labs  03/18/16 0621 03/19/16 0518  NA 135 135  K 4.1 3.4*  CL 104 102  CO2 27 28  GLUCOSE 149* 124*  BUN 9 6  CREATININE 0.62 0.59  CALCIUM 8.0* 8.0*   No results for input(s): LABPROT, INR in the last 72 hours.  Imaging: No results found.  Assessment/Plan: 41 yo female s/p exploratory laparotomy with right hemicolectomy  --Continue NPO with IV fluid hydration --Continue PCA for pain control and will add Toradol scheduled as well.  Hopefully transition to prn dilaudid tomorrow. --Awaiting return of bowel function.  Continue NG tube and give suppository today. --OOB, ambulate.   Howie Ill, MD [facility]

## 2016-03-19 NOTE — Progress Notes (Signed)
RN has offered multiple times to help pt ambulate outside of her room in the hallway, pt has only ambulated within the room to the bathroom. Pt states "I do not want to walk out there because all the flu pt.'s"  Will continue to encourage ambulation.  Gina Leblond [employer]

## 2016-03-20 LAB — BASIC METABOLIC PANEL
Anion gap: 4 — ABNORMAL LOW (ref 5–15)
BUN: 7 mg/dL (ref 6–20)
CO2: 29 mmol/L (ref 22–32)
Calcium: 8 mg/dL — ABNORMAL LOW (ref 8.9–10.3)
Chloride: 101 mmol/L (ref 101–111)
Creatinine, Ser: 0.64 mg/dL (ref 0.44–1.00)
GFR calc Af Amer: >60 mL/min (ref >60)
GFR calc non Af Amer: >60 mL/min (ref >60)
Glucose, Bld: 113 mg/dL — ABNORMAL HIGH (ref 65–99)
Potassium: 3.7 mmol/L (ref 3.5–5.1)
Sodium: 134 mmol/L — ABNORMAL LOW (ref 135–145)

## 2016-03-20 LAB — MAGNESIUM: Magnesium: 2 mg/dL (ref 1.7–2.4)

## 2016-03-20 LAB — SURGICAL PATHOLOGY

## 2016-03-20 MED ORDER — CYCLOBENZAPRINE HCL 10 MG PO TABS
5.0000 mg | ORAL_TABLET | Freq: Three times a day (TID) | ORAL | Status: DC
  Administered 2016-03-20 – 2016-03-26 (×3): 5 mg via ORAL
  Filled 2016-03-20 (×6): qty 1

## 2016-03-20 MED ORDER — HYDROMORPHONE HCL 1 MG/ML IJ SOLN
0.5000 mg | INTRAMUSCULAR | Status: DC | PRN
  Administered 2016-03-20 (×2): 0.5 mg via INTRAVENOUS
  Filled 2016-03-20 (×2): qty 0.5

## 2016-03-20 MED ORDER — HYDROMORPHONE HCL 1 MG/ML IJ SOLN
1.0000 mg | INTRAMUSCULAR | Status: DC | PRN
  Administered 2016-03-20 – 2016-03-26 (×35): 1 mg via INTRAVENOUS
  Filled 2016-03-20 (×36): qty 1

## 2016-03-20 NOTE — Progress Notes (Signed)
03/20/2016  Subjective: Patient is 3 Days Post-Op status post expiratory laparotomy with right hemicolectomy due to small bowel obstruction. No acute events overnight. Patient reports that she cannot take Toradol due to side effects. She has not ambulated much. No flatus yet. Her NG tube was flushed this morning due to nausea complaints and collected 200 cc within 2 hours.  Vital signs: Temp:  [98 F (36.7 C)-99.5 F (37.5 C)] 99.4 F (37.4 C) (02/06 0736) Pulse Rate:  [88-104] 104 (02/06 0736) Resp:  [10-17] 16 (02/06 0831) BP: (119-135)/(74-85) 135/84 (02/06 0736) SpO2:  [95 %-100 %] 100 % (02/06 0736) FiO2 (%):  [100 %] 100 % (02/06 0831)   Intake/Output: 02/05 0701 - 02/06 0700 In: 2104.6 [I.V.:2044.6; NG/GT:60] Out: 1930 [Urine:1550; Emesis/NG output:380] Last BM Date:  (PTA)  Physical Exam: Constitutional: No acute distress Abdomen:  Soft, mildly distended, appropriately tender to palpation. Midline incision is clean dry and intact with staples and no evidence of infection. NG tube in place.  Labs:   Recent Labs  03/18/16 0621 03/19/16 0518  WBC 5.4 4.8  HGB 10.3* 10.0*  HCT 30.2* 29.8*  PLT 224 222    Recent Labs  03/19/16 0518 03/20/16 0409  NA 135 134*  K 3.4* 3.7  CL 102 101  CO2 28 29  GLUCOSE 124* 113*  BUN 6 7  CREATININE 0.59 0.64  CALCIUM 8.0* 8.0*   No results for input(s): LABPROT, INR in the last 72 hours.  Imaging: No results found.  Assessment/Plan: 41 year old female status post expiratory laparotomy with right hemicolectomy.  -Continue nothing by mouth with IV fluid hydration and NG tube to suction. -Continue suppositories given the patient's history of chronic constipation. -Discontinue Dilaudid PCA and transition to Dilaudid IV when necessary. -Discontinue Toradol and change to Flexeril. -Encouraged patient to ambulate more as this will help with her bowel function.   Howie Ill, MD [facility]

## 2016-03-21 ENCOUNTER — Inpatient Hospital Stay: Payer: BLUE CROSS/BLUE SHIELD

## 2016-03-21 ENCOUNTER — Other Ambulatory Visit: Payer: BLUE CROSS/BLUE SHIELD

## 2016-03-21 LAB — BASIC METABOLIC PANEL
Anion gap: 4 — ABNORMAL LOW (ref 5–15)
BUN: 10 mg/dL (ref 6–20)
CO2: 33 mmol/L — ABNORMAL HIGH (ref 22–32)
Calcium: 8.3 mg/dL — ABNORMAL LOW (ref 8.9–10.3)
Chloride: 96 mmol/L — ABNORMAL LOW (ref 101–111)
Creatinine, Ser: 0.69 mg/dL (ref 0.44–1.00)
GFR calc Af Amer: >60 mL/min (ref >60)
GFR calc non Af Amer: >60 mL/min (ref >60)
Glucose, Bld: 126 mg/dL — ABNORMAL HIGH (ref 65–99)
Potassium: 3.7 mmol/L (ref 3.5–5.1)
Sodium: 133 mmol/L — ABNORMAL LOW (ref 135–145)

## 2016-03-21 LAB — MAGNESIUM: Magnesium: 2 mg/dL (ref 1.7–2.4)

## 2016-03-21 NOTE — Progress Notes (Signed)
03/21/2016  Subjective: Patient is 4 Days Post-Op status post expiratory laparotomy with right hemicolectomy for small bowel obstruction. No acute events overnight. Patient was able to ambulate more yesterday out of her room. NG tube did put out as significant amount yesterday but the patient reports a very small smear of stool overnight. Pain is better controlled with IV Dilaudid  Vital signs: Temp:  [98 F (36.7 C)-99.1 F (37.3 C)] 98 F (36.7 C) (02/07 0854) Pulse Rate:  [78-101] 78 (02/07 0854) Resp:  [18] 18 (02/07 0514) BP: (117-133)/(77-93) 133/89 (02/07 0854) SpO2:  [97 %-100 %] 100 % (02/07 0854)   Intake/Output: 02/06 0701 - 02/07 0700 In: 447.9 [I.V.:357.9; NG/GT:90] Out: 1330 [Emesis/NG output:1330] Last BM Date:  (PTA)  Physical Exam: Constitutional: No acute distress Abdomen:  Soft, mildly distended, appropriately tender to palpation. Incision is clean dry and intact. No evidence of infection. NG tube in place with stomach contents.  Labs:   Recent Labs  03/19/16 0518  WBC 4.8  HGB 10.0*  HCT 29.8*  PLT 222    Recent Labs  03/20/16 0409 03/21/16 0514  NA 134* 133*  K 3.7 3.7  CL 101 96*  CO2 29 33*  GLUCOSE 113* 126*  BUN 7 10  CREATININE 0.64 0.69  CALCIUM 8.0* 8.3*   No results for input(s): LABPROT, INR in the last 72 hours.  Imaging: Dg Abd 1 View  Result Date: 03/21/2016 CLINICAL DATA:  Abdominal distention, 3 days postop from exploratory laparotomy with right hemicolectomy due to small-bowel obstruction EXAM: ABDOMEN - 1 VIEW COMPARISON:  CT abdomen pelvis of 03/17/2016 FINDINGS: There is a small amount of contrast throughout the nondistended colon from recent CT of the abdomen. There is persistent slight dilatation of several loops of small bowel which could indicate persistent small-bowel obstruction versus ileus postoperatively. No opaque calculi is seen. NG tube tip is in the fundus of the stomach. IMPRESSION: 1. Still slight gaseous  distention of small bowel loops which may indicate ileus or persistent partial small bowel obstruction. 2. NG tube tip in the fundus of the stomach. Electronically Signed   By: Dwyane Dee M.D.   On: 03/21/2016 08:54    Assessment/Plan: 41 year old female status post expiratory laparotomy with right hemicolectomy.  -KUB done this morning shows only slight distention of small bowel loops with good placement of her NG tube. Likely she still having a postop ileus at this point. -Continue NG tube to suction with nothing by mouth and IV fluid hydration. -Continue ambulation and pain control.   Howie Ill, MD [facility]

## 2016-03-21 NOTE — Plan of Care (Signed)
Problem: Bowel/Gastric: Goal: Will not experience complications related to bowel motility Outcome: Progressing Pt had first post op Bm today  Problem: Activity: Goal: Mobility will improve Outcome: Progressing Pt ambulated 4 laps total in hall today.

## 2016-03-22 DIAGNOSIS — R140 Abdominal distension (gaseous): Secondary | ICD-10-CM

## 2016-03-22 DIAGNOSIS — R1013 Epigastric pain: Secondary | ICD-10-CM

## 2016-03-22 LAB — CBC
HCT: 29.9 % — ABNORMAL LOW (ref 35.0–47.0)
Hemoglobin: 10 g/dL — ABNORMAL LOW (ref 12.0–16.0)
MCH: 28.3 pg (ref 26.0–34.0)
MCHC: 33.5 g/dL (ref 32.0–36.0)
MCV: 84.3 fL (ref 80.0–100.0)
Platelets: 301 10*3/uL (ref 150–440)
RBC: 3.54 MIL/uL — ABNORMAL LOW (ref 3.80–5.20)
RDW: 15.7 % — ABNORMAL HIGH (ref 11.5–14.5)
WBC: 4 10*3/uL (ref 3.6–11.0)

## 2016-03-22 MED ORDER — PREDNISONE 10 MG PO TABS
50.0000 mg | ORAL_TABLET | Freq: Four times a day (QID) | ORAL | Status: AC
  Administered 2016-03-22 – 2016-03-23 (×3): 50 mg via ORAL
  Filled 2016-03-22 (×3): qty 2

## 2016-03-22 MED ORDER — PANTOPRAZOLE SODIUM 40 MG IV SOLR
40.0000 mg | INTRAVENOUS | Status: DC
  Administered 2016-03-22 – 2016-03-26 (×5): 40 mg via INTRAVENOUS
  Filled 2016-03-22 (×5): qty 40

## 2016-03-22 MED ORDER — DIPHENHYDRAMINE HCL 25 MG PO CAPS
50.0000 mg | ORAL_CAPSULE | Freq: Once | ORAL | Status: AC
  Administered 2016-03-23: 50 mg via ORAL
  Filled 2016-03-22: qty 2

## 2016-03-22 MED ORDER — IOPAMIDOL (ISOVUE-300) INJECTION 61%
15.0000 mL | INTRAVENOUS | Status: AC
  Administered 2016-03-23 (×2): 15 mL via ORAL

## 2016-03-22 MED ORDER — DIATRIZOATE MEGLUMINE & SODIUM 66-10 % PO SOLN: 15.0000 mL | ORAL | Status: DC

## 2016-03-22 MED ORDER — LORAZEPAM 2 MG/ML IJ SOLN: 0.5000 mg | Freq: Four times a day (QID) | INTRAMUSCULAR | Status: DC

## 2016-03-22 MED ORDER — LORAZEPAM 2 MG/ML IJ SOLN
0.5000 mg | Freq: Four times a day (QID) | INTRAMUSCULAR | Status: DC | PRN
  Administered 2016-03-22 (×2): 0.5 mg via INTRAVENOUS
  Filled 2016-03-22 (×2): qty 1

## 2016-03-22 NOTE — Consult Note (Signed)
Midge Minium, MD [facility]  [address], [address] [place], [place] [address] Phone: [phone] Fax : [phone]  Consultation  Referring Provider:     Dr. Aleen Campi Primary Care Physician:  Lyndon Code, MD Primary Gastroenterologist:  Dr. Mechele Collin         Reason for Consultation:     Possible inflammatory bowel disease  Date of Admission:  03/17/2016 Date of Consultation:  03/22/2016         HPI:   Chelsea Carroll is a 41 y.o. female who reports that she has been told she had intestinal problems since she was a younger all.  She remembers being diagnosed with intestinal problems back when she was 41 years old. The patient has had intermittent symptoms over the years and she states she was given prednisone and got better.  The patient had been seen by a pediatric nephrologist for years and then was seen by Dr. Mechele Collin.  She has not seen him in many years and had been fine until recently when she started to have severe abdominal pain. The pain had gradually gotten worse and was crampy and the patient was brought to the ER.  She had a similar episode a few months ago that lasted several hours but went away.  She denies any family history of Crohn's disease or inflammatory bowel disease.  The patient also reports that she was told in the past that she may have Crohn's disease but she denies ever being diagnosed officially with Crohn's disease. The patient had a right hemicolectomy with positive her terminal ileum removed.  The pathology was reported as acute and chronic inflammation without distinct features of Crohn's disease such as granulomatous.  It was suggested by pathology that other causes such as ischemia or infection be entertained as the possible cause of this patient's stricture.  Past Medical History:  Diagnosis Date  . Hypertension   . Migraines     Past Surgical History:  Procedure Laterality Date  . LAPAROTOMY N/A 03/17/2016   Procedure: EXPLORATORY LAPAROTOMY,right colon  resection.terminal resection,appendectomy;  Surgeon: Lattie Haw, MD;  Location: [facility] ORS;  Service: General;  Laterality: N/A;    Prior to Admission medications   Medication Sig Start Date End Date Taking? Authorizing Provider  carvedilol (COREG) 6.25 MG tablet Take 1 tablet (6.25 mg total) by mouth 2 (two) times daily. 06/17/15  Yes Iran Ouch, MD  ibuprofen (ADVIL,MOTRIN) 200 MG tablet Take 200 mg by mouth every 6 (six) hours as needed.   Yes Historical Provider, MD  lisinopril (PRINIVIL,ZESTRIL) 5 MG tablet Take 1 tablet (5 mg total) by mouth daily. 03/02/16  Yes Iran Ouch, MD  Multiple Vitamin (MULTIVITAMIN) tablet Take 1 tablet by mouth daily.   Yes Historical Provider, MD  traMADol (ULTRAM) 50 MG tablet Take 50 mg by mouth every 6 (six) hours as needed.   Yes Historical Provider, MD    Family History  Problem Relation Age of Onset  . Hyperlipidemia Mother   . Hypertension Mother      Social History  Substance Use Topics  . Smoking status: Never Smoker  . Smokeless tobacco: Never Used  . Alcohol use Yes     Comment: occasional    Allergies as of 03/17/2016 - Review Complete 03/17/2016  Allergen Reaction Noted  . Codeine  01/15/2014  . Iodine Hives 06/19/2013    Review of Systems:    All systems reviewed and negative except where noted in HPI.   Physical Exam:  Vital signs in last 24 hours: Temp:  [98 F (36.7 C)-98.3 F (36.8 C)] 98 F (36.7 C) (02/08 2010) Pulse Rate:  [73-102] 102 (02/08 2010) Resp:  [16-18] 18 (02/08 2010) BP: (121-143)/(77-89) 143/89 (02/08 2010) SpO2:  [96 %-100 %] 96 % (02/08 2010) Last BM Date: 03/21/16 General:   Pleasant, cooperative in NAD Head:  Normocephalic and atraumatic. Eyes:   No icterus.   Conjunctiva pink. PERRLA. Ears:  Normal auditory acuity. Neck:  Supple; no masses or thyroidomegaly Lungs: Respirations even and unlabored. Lungs clear to auscultation bilaterally.   No wheezes, crackles, or rhonchi.  Heart:   Regular rate and rhythm;  Without murmur, clicks, rubs or gallops Abdomen:  Soft, nondistended, Diffusely tender with staples in the midline. No bowel sounds were heard. No appreciable masses or hepatomegaly.  No rebound or guarding.  Rectal:  Not performed. Msk:  Symmetrical without gross deformities.    Extremities:  Without edema, cyanosis or clubbing. Neurologic:  Alert and oriented x3;  grossly normal neurologically. Skin:  Intact without significant lesions or rashes. Cervical Nodes:  No significant cervical adenopathy. Psych:  Alert and cooperative. Normal affect.  LAB RESULTS:  Recent Labs  03/22/16 0458  WBC 4.0  HGB 10.0*  HCT 29.9*  PLT 301   BMET  Recent Labs  03/20/16 0409 03/21/16 0514  NA 134* 133*  K 3.7 3.7  CL 101 96*  CO2 29 33*  GLUCOSE 113* 126*  BUN 7 10  CREATININE 0.64 0.69  CALCIUM 8.0* 8.3*   LFT No results for input(s): PROT, ALBUMIN, AST, ALT, ALKPHOS, BILITOT, BILIDIR, IBILI in the last 72 hours. PT/INR No results for input(s): LABPROT, INR in the last 72 hours.  STUDIES: Dg Abd 1 View  Result Date: 03/21/2016 CLINICAL DATA:  Abdominal distention, 3 days postop from exploratory laparotomy with right hemicolectomy due to small-bowel obstruction EXAM: ABDOMEN - 1 VIEW COMPARISON:  CT abdomen pelvis of 03/17/2016 FINDINGS: There is a small amount of contrast throughout the nondistended colon from recent CT of the abdomen. There is persistent slight dilatation of several loops of small bowel which could indicate persistent small-bowel obstruction versus ileus postoperatively. No opaque calculi is seen. NG tube tip is in the fundus of the stomach. IMPRESSION: 1. Still slight gaseous distention of small bowel loops which may indicate ileus or persistent partial small bowel obstruction. 2. NG tube tip in the fundus of the stomach. Electronically Signed   By: Dwyane Dee M.D.   On: 03/21/2016 08:54      Impression / Plan:   Chelsea Carroll is a  41 y.o. y/o female admitted with a bowel obstruction that showed on pathology to be consistent with a stricture with chronic and acute features.  It was reported that signs of Crohn's were not seen and that other causes should be entertained. The patient has had intermittent symptoms since she was a young girl.  I have explained to the patient that the diagnosis of Crohn's needs to be made due to the indications it has on maintenance therapy and possible need for future surgery if her Crohn's develops in another part of her intestines.  I will order the patient's lead to be sent off for a IBD panel.  Depending on the results of this will dictate if any further workup is needed such as a small bowel follow-through.  The patient and her husband have been explained the plan and agree with it.  Thank you for involving me in the  care of this patient.      LOS: 5 days   Midge Minium, MD  03/22/2016, 8:14 PM   Note: This dictation was prepared with Dragon dictation along with smaller phrase technology. Any transcriptional errors that result from this process are unintentional.

## 2016-03-22 NOTE — Progress Notes (Signed)
03/22/2016  Subjective: Patient is 5 Days Post-Op status post expiratory laparotomy with a right hemicolectomy for small bowel obstruction. Pathology report results with ulceration of the terminal ileum consistent and chronic inflammatory findings consistent with Crohn's disease. NG tube still with higher output and the patient reports still having abdominal pain. Clamp trial was attempted today and the patient has subsequent worsening abdominal pain. NG tube was placed back to suction with 150 cc removed.  Vital signs: Temp:  [98 F (36.7 C)-98.3 F (36.8 C)] 98.3 F (36.8 C) (02/08 0753) Pulse Rate:  [73-82] 82 (02/08 0753) Resp:  [16-18] 16 (02/08 0543) BP: (121-130)/(77-82) 122/77 (02/08 0753) SpO2:  [96 %-100 %] 98 % (02/08 0753)   Intake/Output: 02/07 0701 - 02/08 0700 In: 2009.3 [I.V.:1949.3; NG/GT:60] Out: 1500 [Urine:400; Emesis/NG output:1100] Last BM Date: 03/21/16  Physical Exam: Constitutional: No acute distress Abdomen:  Soft, mildly distended, with tenderness to palpation over the incision. Staples are clean dry and intact with no evidence of infection. NG tube in place with gastric contents and canister.  Labs:   Recent Labs  03/22/16 0458  WBC 4.0  HGB 10.0*  HCT 29.9*  PLT 301    Recent Labs  03/20/16 0409 03/21/16 0514  NA 134* 133*  K 3.7 3.7  CL 101 96*  CO2 29 33*  GLUCOSE 113* 126*  BUN 7 10  CREATININE 0.64 0.69  CALCIUM 8.0* 8.3*   No results for input(s): LABPROT, INR in the last 72 hours.  Imaging: No results found.  Assessment/Plan: 41 year old female status post expiratory laparotomy with right hemicolectomy.  -Given the patient's recent abdominal pain and no real return of bowel function yet will obtain a CT scan of the abdomen and pelvis with oral contrast only given that the patient has allergy to IV contrast. -Continue NG tube to suction and nothing by mouth with IV fluid hydration.   Howie Ill, MD [facility]

## 2016-03-22 NOTE — Progress Notes (Signed)
Initial Nutrition Assessment  DOCUMENTATION CODES:   Not applicable  INTERVENTION:  Diet progression per Surgery.  If patient remains unable to advance diet by day 10 of inadequate intake (2/12), would recommend initiation of TPN.   Recommend obtaining updated weight for patient.  NUTRITION DIAGNOSIS:   Inadequate oral intake related to inability to eat as evidenced by NPO status.  GOAL:   Patient will meet greater than or equal to 90% of their needs  MONITOR:   Diet advancement, Labs, I & O's, Weight trends  REASON FOR ASSESSMENT:   NPO/Clear Liquid Diet    ASSESSMENT:   41 year old female with PMHx of HTN presented with abdominal pain, N/V, absence of flatus and bowel movements found to have small bowel obstruction now s/p right colon resection with terminal ileum resection on 2/8.    -Per Surgery note, patient now likely with post-op ileus. NG tube clamp trial was unsuccessful today.  -Patient discussed in progression rounds today. She has been ambulating and had a bowel movement yesterday. Still with significant abdominal pain.   Spoke with patient and family members at bedside. Patient reports the last day she had any food was 2/1. Starting 2/2, she began having abdominal pain, N/V and could not eat anything. Patient now day 6 NPO (counting 1 day PTA). She reports typical intake when her appetite is good is only 1.5-2 meals daily. Patient reports she is still having persistent abdominal pain and nausea.  UBW is 125-130 lbs and patient was weight stable PTA. RD attempted to obtain bed scale weight to trend weight since admission, but bed scale was not zeroed.   NG tube: 1.1 L output from 0700 on 2/7 to 0700 on 2/8.   Medications reviewed and include: pantoprazole, prednisone 50 mg Q6hrs, D5-1/2NS @ 75 ml/hr (90 grams dextrose, 306 kcal daily), Dilaudid PRN, Ativan PRN, Zofran PRN.   Labs reviewed: Sodium 133, Chloride 96, CO2 33, Anion gap 4.   Nutrition-Focused  physical exam completed. Findings are no fat depletion, no muscle depletion, and no edema.   Patient at risk for acute malnutrition in setting of inadequate intake for 6 days. Does not meet criteria at this time since unable to trend weight.  Discussed with RN.   Diet Order:  Diet NPO time specified Except for: Sips with Meds, Ice Chips  Skin:  Reviewed, no issues  Last BM:  03/21/2016  Height:   Ht Readings from Last 1 Encounters:  03/17/16 5\' 2"  (1.575 m)    Weight:   Wt Readings from Last 1 Encounters:  03/17/16 130 lb (59 kg)    Ideal Body Weight:  50 kg  BMI:  Body mass index is 23.78 kg/m.  Estimated Nutritional Needs:   Kcal:  1575-1800 (MSJ x 1.3-1.5)  Protein:  70-83 grams (1.2-1.4 grams/kg)  Fluid:  1.8-2 L/day (30-35 ml/kg)  EDUCATION NEEDS:   No education needs identified at this time  Helane Rima, MS, RD, LDN Pager: [phone] After Hours Pager: [phone]

## 2016-03-23 ENCOUNTER — Inpatient Hospital Stay: Payer: BLUE CROSS/BLUE SHIELD

## 2016-03-23 ENCOUNTER — Encounter: Payer: Self-pay | Admitting: Radiology

## 2016-03-23 DIAGNOSIS — I10 Essential (primary) hypertension: Secondary | ICD-10-CM

## 2016-03-23 DIAGNOSIS — K5090 Crohn's disease, unspecified, without complications: Secondary | ICD-10-CM

## 2016-03-23 DIAGNOSIS — R531 Weakness: Secondary | ICD-10-CM

## 2016-03-23 DIAGNOSIS — K56609 Unspecified intestinal obstruction, unspecified as to partial versus complete obstruction: Secondary | ICD-10-CM

## 2016-03-23 DIAGNOSIS — Z79899 Other long term (current) drug therapy: Secondary | ICD-10-CM

## 2016-03-23 DIAGNOSIS — R5383 Other fatigue: Secondary | ICD-10-CM

## 2016-03-23 DIAGNOSIS — E785 Hyperlipidemia, unspecified: Secondary | ICD-10-CM

## 2016-03-23 DIAGNOSIS — I81 Portal vein thrombosis: Secondary | ICD-10-CM

## 2016-03-23 DIAGNOSIS — K561 Intussusception: Principal | ICD-10-CM

## 2016-03-23 LAB — BASIC METABOLIC PANEL
Anion gap: 8 (ref 5–15)
BUN: 8 mg/dL (ref 6–20)
CO2: 27 mmol/L (ref 22–32)
Calcium: 8.9 mg/dL (ref 8.9–10.3)
Chloride: 98 mmol/L — ABNORMAL LOW (ref 101–111)
Creatinine, Ser: 0.55 mg/dL (ref 0.44–1.00)
GFR calc Af Amer: >60 mL/min (ref >60)
GFR calc non Af Amer: >60 mL/min (ref >60)
Glucose, Bld: 125 mg/dL — ABNORMAL HIGH (ref 65–99)
Potassium: 3.9 mmol/L (ref 3.5–5.1)
Sodium: 133 mmol/L — ABNORMAL LOW (ref 135–145)

## 2016-03-23 LAB — HEPATIC FUNCTION PANEL
ALT: 14 U/L (ref 14–54)
AST: 20 U/L (ref 15–41)
Albumin: 3.1 g/dL — ABNORMAL LOW (ref 3.5–5.0)
Alkaline Phosphatase: 70 U/L (ref 38–126)
Bilirubin, Direct: <0.1 mg/dL — ABNORMAL LOW (ref 0.1–0.5)
Total Bilirubin: 0.5 mg/dL (ref 0.3–1.2)
Total Protein: 6.7 g/dL (ref 6.5–8.1)

## 2016-03-23 LAB — CBC WITH DIFFERENTIAL/PLATELET
Basophils Absolute: 0 10*3/uL (ref 0–0.1)
Basophils Relative: 0 %
Eosinophils Absolute: 0 10*3/uL (ref 0–0.7)
Eosinophils Relative: 0 %
HCT: 31.7 % — ABNORMAL LOW (ref 35.0–47.0)
Hemoglobin: 10.9 g/dL — ABNORMAL LOW (ref 12.0–16.0)
Lymphocytes Relative: 9 %
Lymphs Abs: 0.4 10*3/uL — ABNORMAL LOW (ref 1.0–3.6)
MCH: 28.3 pg (ref 26.0–34.0)
MCHC: 34.3 g/dL (ref 32.0–36.0)
MCV: 82.4 fL (ref 80.0–100.0)
Monocytes Absolute: 0.3 10*3/uL (ref 0.2–0.9)
Monocytes Relative: 6 %
Neutro Abs: 4.3 10*3/uL (ref 1.4–6.5)
Neutrophils Relative %: 85 %
Platelets: 342 10*3/uL (ref 150–440)
RBC: 3.84 MIL/uL (ref 3.80–5.20)
RDW: 15.9 % — ABNORMAL HIGH (ref 11.5–14.5)
WBC: 5.1 10*3/uL (ref 3.6–11.0)

## 2016-03-23 LAB — MAGNESIUM: Magnesium: 2 mg/dL (ref 1.7–2.4)

## 2016-03-23 MED ORDER — ENOXAPARIN SODIUM 60 MG/0.6ML ~~LOC~~ SOLN
1.0000 mg/kg | Freq: Two times a day (BID) | SUBCUTANEOUS | Status: DC
Start: 2016-03-23 — End: 2016-03-26
  Administered 2016-03-23 – 2016-03-26 (×6): 60 mg via SUBCUTANEOUS
  Filled 2016-03-23 (×8): qty 0.6

## 2016-03-23 MED ORDER — LORAZEPAM 2 MG/ML IJ SOLN
0.5000 mg | Freq: Three times a day (TID) | INTRAMUSCULAR | Status: DC | PRN
  Administered 2016-03-23 – 2016-03-24 (×3): 0.5 mg via INTRAVENOUS
  Filled 2016-03-23 (×3): qty 1

## 2016-03-23 MED ORDER — IOPAMIDOL (ISOVUE-300) INJECTION 61%
100.0000 mL | Freq: Once | INTRAVENOUS | Status: AC | PRN
  Administered 2016-03-23: 100 mL via INTRAVENOUS

## 2016-03-23 NOTE — Progress Notes (Signed)
03/23/2016  Subjective: Patient is 6 Days Post-Op status post exploratory laparotomy with right hemicolectomy. Small bowel obstruction. Patient reports having a very small bowel movement yesterday although her NG output was still high. She had a CT scan yesterday as he continued having abdominal pain with no real return of bowel function. There was no evidence of small bowel obstruction but likely is ileus postoperatively. She was noted to have portal vein thrombosis without any evidence of bowel congestion on CT scan.  Vital signs: Temp:  [97.8 F (36.6 C)-98.1 F (36.7 C)] 98.1 F (36.7 C) (02/09 0824) Pulse Rate:  [72-102] 72 (02/09 0824) Resp:  [18] 18 (02/09 0440) BP: (121-143)/(83-89) 130/84 (02/09 0824) SpO2:  [93 %-99 %] 93 % (02/09 0824)   Intake/Output: 02/08 0701 - 02/09 0700 In: 1680 [I.V.:1650; NG/GT:30] Out: 1450 [Urine:600; Emesis/NG output:850] Last BM Date: 03/22/16  Physical Exam: Constitutional: No acute distress Abdomen:  Soft, mildly distended, with appropriate tenderness to palpation. Midline incision is clean dry and intact with staples. Minimal residual ecchymosis. NG tube in place with gastric contents and canister.  Labs:   Recent Labs  03/22/16 0458 03/23/16 0445  WBC 4.0 5.1  HGB 10.0* 10.9*  HCT 29.9* 31.7*  PLT 301 342    Recent Labs  03/21/16 0514 03/23/16 0445  NA 133* 133*  K 3.7 3.9  CL 96* 98*  CO2 33* 27  GLUCOSE 126* 125*  BUN 10 8  CREATININE 0.69 0.55  CALCIUM 8.3* 8.9   No results for input(s): LABPROT, INR in the last 72 hours.  Imaging: Ct Abdomen Pelvis W Contrast  Result Date: 03/23/2016 CLINICAL DATA:  Severe crampy abdominal pain. Recent laparotomy 03/17/2016. History of intestinal problems since age 43. Possible Crohn's disease. Right hemicolectomy. EXAM: CT ABDOMEN AND PELVIS WITH CONTRAST TECHNIQUE: Multidetector CT imaging of the abdomen and pelvis was performed using the standard protocol following bolus  administration of intravenous contrast. CONTRAST:  ISOVUE-300 IOPAMIDOL (ISOVUE-300) INJECTION 61% COMPARISON:  04/06/2016 FINDINGS: Lower chest: The lung bases are clear.  No esophageal distention. Hepatobiliary: Diffuse low attenuation change throughout the liver suggesting fatty infiltration. Filling defect in the distal main portal vein extending into the right branches suggesting portal venous thrombosis. No focal liver lesions are identified. Gallbladder is distended without stone or wall thickening. No bile duct dilatation. Pancreas: Unremarkable. No pancreatic ductal dilatation or surrounding inflammatory changes. Spleen: Normal in size without focal abnormality. Adrenals/Urinary Tract: No adrenal gland nodules. Kidneys are symmetrical. No hydronephrosis or hydroureter. Bladder contains gas. This could result from catheterization, cystitis, or fistula. Correlation with urinalysis is recommended. No bladder wall thickening. Stomach/Bowel: Enteric tube with tip in the stomach. Stomach appears normal. Small bowel are dilated and fluid-filled. Contrast material is demonstrated in the colon, likely from the previous CT scan. There has been interval postoperative change since the previous study. Changes may represent postoperative ileus although residual or recurrent obstruction is not excluded. Small amount of mesenteric edema. Prominent lymph nodes in the mesenteric without pathologic enlargement likely represent reactive nodes. Colon is not abnormally distended. No fluid collections to suggest abscess. No contrast extravasation. Vascular/Lymphatic: No significant vascular findings are present. No enlarged abdominal or pelvic lymph nodes. Reproductive: Uterus and ovaries are not enlarged. Small amount of free fluid in the pelvis is likely physiologic. Other: A few tiny gas bubbles are demonstrated in the upper abdomen, likely postoperative. Postoperative changes in the anterior abdominal wall with skin  clips and linear infiltration along the midline. Small amount  of edema in the anterior subcutaneous fat. Musculoskeletal: No acute or significant osseous findings. IMPRESSION: Recent postoperative changes including partial colonic resection. Persistent small bowel dilatation with fluid-filled loops may represent postoperative ileus although residual or recurrent obstruction not excluded. 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 Gas in the bladder may be due to instrumentation or other bladder path biology. These results were called by telephone at the time of interpretation on 03/23/2016 at 6:00 am to Dr. Lucretia Roers, who verbally acknowledged these results. Electronically Signed   By: Burman Nieves M.D.   On: 03/23/2016 06:14    Assessment/Plan: 41 year old female status post expiratory laparotomy with right hemicolectomy.  -Continue nothing by mouth with NG tube to suction and IV fluid hydration. -Continue suppository and may start using enemas today for bowel function. Given that she still has high NG output would not consider giving her any by mouth bowel regimen yet. [facility] consult hematology today for further evaluation and possible management of this portal vein thrombosis. -appreciate gastroenterology's input in the management of her possible Crohn's. Currently IBD panel is pending.   Howie Ill, MD [facility]

## 2016-03-23 NOTE — Consult Note (Signed)
Hematology/Oncology Consult note [facility] Telephone:([phone] [phone]  Patient Care Team: Lyndon Code, MD as PCP - General (Internal Medicine) Lyndon Code, MD (Internal Medicine)   Name of the patient: Chelsea Carroll  [id number]  1975-06-27    Reason for consultation- portal venous thrombosis   Requesting physician- Dr. Aleen Campi  Date of visit: 03/23/2016  History of presenting illness- patient is a 41 year old female with a possible history of Crohn's disease in the past. She has seen Dr. Mechele Collin [facility] and has not benign treatment recently. She presented to the hospital on 03/17/2016 with acute abdominal pain. CT abdomen and pelvis done without contrast showed acute small bowel obstruction with transition point in the pelvic small bowel where there appears to be enteroenteric intussusception. Mildly inflamed appearing distal small bowel. Patient was taken to the OR and underwent extremity laparotomy and right hemicolectomy. Pathology showed: DIAGNOSIS:  A. TERMINAL ILEUM; RESECTION:  - ACUTE ENTERITIS WITH ULCERATION AND ARCHITECTURAL FEATURES OF  CHRONICITY.  - SEE COMMENT.  - UNREMARKABLE AND VIABLE MARGINS OF RESECTION.  - NEGATIVE FOR DYSPLASIA AND MALIGNANCY.   B. APPENDIX; APPENDECTOMY:  - APPENDIX WITH FIBROUS OBLITERATION OF THE LUMEN.  - NEGATIVE FOR DYSPLASIA AND MALIGNANCY.   C. COLON, RIGHT; RESECTION:  - PROMINENT MUCOSAL LYMPHOID AGGREGATES.  - UNREMARKABLE AND VIABLE MARGINS OF RESECTION.  - THREE REACTIVE LYMPH NODES.  - NEGATIVE FOR COLITIS, DYSPLASIA, AND MALIGNANCY.   Patient was having repeated bouts of abdominal pain post surgery and underwent repeat CT abdomen with contrast today. It showed recent postoperative changes including partial colonic resection. Persistent small bowel dilation with fluid loops may represent postoperative ideas. Filling defects in the portal veins consistent with  portal venous thrombosis.  Patient has not had any prior episodes of thrombosis. Off note she has had ultrasound of the abdomen in the past as well as CT abdomen without contrast in 2009, 2015 and 2017 which did not reveal any evidence of portal venous thrombosis.   Pain scale- 7- abdominal pain   Review of systems- Review of Systems  Constitutional: Positive for malaise/fatigue. Negative for chills, fever and weight loss.  HENT: Negative for congestion, ear discharge and nosebleeds.   Eyes: Negative for blurred vision.  Respiratory: Negative for cough, hemoptysis, sputum production, shortness of breath and wheezing.   Cardiovascular: Negative for chest pain, palpitations, orthopnea and claudication.  Gastrointestinal: Positive for abdominal pain. Negative for blood in stool, constipation, diarrhea, heartburn, melena, nausea and vomiting.  Genitourinary: Negative for dysuria, flank pain, frequency, hematuria and urgency.  Musculoskeletal: Negative for back pain, joint pain and myalgias.  Skin: Negative for rash.  Neurological: Negative for dizziness, tingling, focal weakness, seizures, weakness and headaches.  Endo/Heme/Allergies: Does not bruise/bleed easily.  Psychiatric/Behavioral: Negative for depression and suicidal ideas. The patient does not have insomnia.     Allergies  Allergen Reactions  . Codeine     Other reaction(s): Unknown  . Iodine Hives    Patient Active Problem List   Diagnosis Date Noted  . Abdominal distention   . SBO (small bowel obstruction) 03/17/2016  . Epigastric pain   . Intussusception intestine (HCC)   . Heart palpitations 06/19/2013  . Chest pain 06/19/2013  . HTN (hypertension) 06/19/2013  . Hyperlipidemia 06/19/2013     Past Medical History:  Diagnosis Date  . Hypertension   . Migraines      Past Surgical History:  Procedure Laterality Date  . LAPAROTOMY N/A  03/17/2016   Procedure: EXPLORATORY LAPAROTOMY,right colon resection.terminal  resection,appendectomy;  Surgeon: Lattie Haw, MD;  Location: [facility] ORS;  Service: General;  Laterality: N/A;    Social History   Social History  . Marital status: Married    Spouse name: N/A  . Number of children: N/A  . Years of education: N/A   Occupational History  . Not on file.   Social History Main Topics  . Smoking status: Never Smoker  . Smokeless tobacco: Never Used  . Alcohol use Yes     Comment: occasional  . Drug use: No  . Sexual activity: Not on file   Other Topics Concern  . Not on file   Social History Narrative  . No narrative on file     Family History  Problem Relation Age of Onset  . Hyperlipidemia Mother   . Hypertension Mother      Current Facility-Administered Medications:  .  bisacodyl (DULCOLAX) suppository 10 mg, 10 mg, Rectal, q morning - 10a, Lattie Haw, MD, Stopped at 03/23/16 1008 .  cyclobenzaprine (FLEXERIL) tablet 5 mg, 5 mg, Oral, TID, Henrene Dodge, MD, Stopped at 03/23/16 [phone] .  dextrose 5 % and 0.45 % NaCl with KCl 20 mEq/L infusion, , Intravenous, Continuous, Henrene Dodge, MD, Last Rate: 75 mL/hr at 03/23/16 0637 .  heparin injection 5,000 Units, 5,000 Units, Subcutaneous, Q8H, Lattie Haw, MD, 5,000 Units at 03/23/16 [phone] .  HYDROmorphone (DILAUDID) injection 1 mg, 1 mg, Intravenous, Q3H PRN, Henrene Dodge, MD, 1 mg at 03/23/16 1008 .  LORazepam (ATIVAN) injection 0.5 mg, 0.5 mg, Intravenous, Q8H PRN, Henrene Dodge, MD .  ondansetron (ZOFRAN) tablet 4 mg, 4 mg, Oral, Q6H PRN **OR** ondansetron (ZOFRAN) injection 4 mg, 4 mg, Intravenous, Q6H PRN, Lattie Haw, MD, 4 mg at 03/22/16 1740 .  pantoprazole (PROTONIX) injection 40 mg, 40 mg, Intravenous, Q24H, Henrene Dodge, MD, 40 mg at 03/23/16 1200 .  phenol (CHLORASEPTIC) mouth spray 1 spray, 1 spray, Mouth/Throat, PRN, Lattie Haw, MD, 1 spray at 03/23/16 1200   Physical exam:  Vitals:   03/22/16 0753 03/22/16 2010 03/23/16 0440 03/23/16 0824  BP: 122/77 (!)  143/89 121/83 130/84  Pulse: 82 (!) 102 80 72  Resp:  18 18   Temp: 98.3 F (36.8 C) 98 F (36.7 C) 97.8 F (36.6 C) 98.1 F (36.7 C)  TempSrc: Oral Oral Oral Oral  SpO2: 98% 96% 99% 93%  Weight:      Height:       Physical Exam  Constitutional: She is oriented to person, place, and time.  She is fatigued and appears in mild distress from abdominal pain.  HENT:  Head: Normocephalic and atraumatic.  NG tube in place  Eyes: EOM are normal. Pupils are equal, round, and reactive to light.  Neck: Normal range of motion.  Cardiovascular: Normal rate, regular rhythm and normal heart sounds.   Pulmonary/Chest: Effort normal and breath sounds normal.  Abdominal: Soft. There is tenderness (Diffuse tenderness to palpation on light touch).  Midline operative wound of recent extremity laparotomy is seen. Wound appears well of post and there is no sign of any infection or discharge. Bowel sounds are absent  Neurological: She is alert and oriented to person, place, and time.  Skin: Skin is warm and dry.       CMP Latest Ref Rng & Units 03/23/2016  Glucose 65 - 99 mg/dL 662(H)  BUN 6 - 20 mg/dL 8  Creatinine 4.76 - 5.46  mg/dL 4.09  Sodium 811 - 914 mmol/L 133(L)  Potassium 3.5 - 5.1 mmol/L 3.9  Chloride 101 - 111 mmol/L 98(L)  CO2 22 - 32 mmol/L 27  Calcium 8.9 - 10.3 mg/dL 8.9  Total Protein 6.5 - 8.1 g/dL 6.7  Total Bilirubin 0.3 - 1.2 mg/dL 0.5  Alkaline Phos 38 - 126 U/L 70  AST 15 - 41 U/L 20  ALT 14 - 54 U/L 14   CBC Latest Ref Rng & Units 03/23/2016  WBC 3.6 - 11.0 K/uL 5.1  Hemoglobin 12.0 - 16.0 g/dL 10.9(L)  Hematocrit 35.0 - 47.0 % 31.7(L)  Platelets 150 - 440 K/uL 342     Ct Abdomen Pelvis Wo Contrast  Result Date: 03/17/2016 CLINICAL DATA:  40 year old female with intermittent epigastric, abdominal and pelvic pain since yesterday. Initial encounter. EXAM: CT ABDOMEN AND PELVIS WITHOUT CONTRAST TECHNIQUE: Multidetector CT imaging of the abdomen and pelvis was performed  following the standard protocol without IV contrast. COMPARISON:  Noncontrast CT Abdomen and Pelvis 10/16/2007. Right upper quadrant ultrasound 04/27/2015. FINDINGS: Lower chest: Minor atelectasis or scarring in the medial segment of the right middle lobe, otherwise negative. No pericardial or pleural effusion. Hepatobiliary: Negative noncontrast liver. The gallbladder is mildly distended but otherwise negative. Pancreas: Negative. Spleen: Negative. No abdominal free air or free fluid. Adrenals/Urinary Tract: Negative adrenal glands. Negative noncontrast kidneys. No hydroureter. Stomach/Bowel: Negative rectosigmoid colon aside from some retained stool. Moderate volume of retained stool throughout the left colon, transverse and right colon. Normal appendix (coronal image 30). The terminal ileum is decompressed (series 2, image 58), but just upstream beginning in the pelvis there are dilated fluid-filled small bowel loops throughout the remaining abdomen. There a fairly discrete transition point on series 5, image 38, in there appears to be a small bowel to small bowel intussusception at this transition. No discrete small bowel mass. No discrete Meckel diverticulum. The leading edge of oral contrast has almost reached the dilated pelvic loops. Small bowel loops measure up to 30 mm diameter, and the most distal dilated loops appear indistinct with mild mesenteric stranding (coronal image 19). The proximal jejunum is normal. The duodenum and stomach are normal. Vascular/Lymphatic: No intravenous contrast. Vascular patency is not evaluated in the absence of IV contrast. No lymphadenopathy. Reproductive: Negative. Other: Small volume pelvic free fluid, mostly in the cul-de-sac. Musculoskeletal: No acute osseous abnormality identified. IMPRESSION: 1. Acute Small Bowel Obstruction with transition point in the pelvic small bowel where there appears to be an Enteroenteric Intussusception. Adult intussusception is usually due  to a pathologically lead point. In this area of distal small bowel consider a Meckel's diverticulum or occult small bowel mass. 2. Mildly inflamed appearing distal loops of obstruct the small bowel. Trace pelvic free fluid. No abdominal free fluid. No free air. Electronically Signed   By: Odessa Fleming M.D.   On: 03/17/2016 13:17   Dg Chest 2 View  Result Date: 03/02/2016 CLINICAL DATA:  Chest pain EXAM: CHEST  2 VIEW COMPARISON:  December 25, 2013 FINDINGS: Lungs are clear. Heart size and pulmonary vascularity are normal. No adenopathy. No pneumothorax. There is upper thoracic levoscoliosis. IMPRESSION: No edema or consolidation. Electronically Signed   By: Bretta Bang III M.D.   On: 03/02/2016 11:21   Dg Abd 1 View  Result Date: 03/21/2016 CLINICAL DATA:  Abdominal distention, 3 days postop from exploratory laparotomy with right hemicolectomy due to small-bowel obstruction EXAM: ABDOMEN - 1 VIEW COMPARISON:  CT abdomen pelvis of 03/17/2016 FINDINGS: There  is a small amount of contrast throughout the nondistended colon from recent CT of the abdomen. There is persistent slight dilatation of several loops of small bowel which could indicate persistent small-bowel obstruction versus ileus postoperatively. No opaque calculi is seen. NG tube tip is in the fundus of the stomach. IMPRESSION: 1. Still slight gaseous distention of small bowel loops which may indicate ileus or persistent partial small bowel obstruction. 2. NG tube tip in the fundus of the stomach. Electronically Signed   By: Dwyane Dee M.D.   On: 03/21/2016 08:54   Ct Abdomen Pelvis W Contrast  Result Date: 03/23/2016 CLINICAL DATA:  Severe crampy abdominal pain. Recent laparotomy 03/17/2016. History of intestinal problems since age 19. Possible Crohn's disease. Right hemicolectomy. EXAM: CT ABDOMEN AND PELVIS WITH CONTRAST TECHNIQUE: Multidetector CT imaging of the abdomen and pelvis was performed using the standard protocol following bolus  administration of intravenous contrast. CONTRAST:  ISOVUE-300 IOPAMIDOL (ISOVUE-300) INJECTION 61% COMPARISON:  04/06/2016 FINDINGS: Lower chest: The lung bases are clear.  No esophageal distention. Hepatobiliary: Diffuse low attenuation change throughout the liver suggesting fatty infiltration. Filling defect in the distal main portal vein extending into the right branches suggesting portal venous thrombosis. No focal liver lesions are identified. Gallbladder is distended without stone or wall thickening. No bile duct dilatation. Pancreas: Unremarkable. No pancreatic ductal dilatation or surrounding inflammatory changes. Spleen: Normal in size without focal abnormality. Adrenals/Urinary Tract: No adrenal gland nodules. Kidneys are symmetrical. No hydronephrosis or hydroureter. Bladder contains gas. This could result from catheterization, cystitis, or fistula. Correlation with urinalysis is recommended. No bladder wall thickening. Stomach/Bowel: Enteric tube with tip in the stomach. Stomach appears normal. Small bowel are dilated and fluid-filled. Contrast material is demonstrated in the colon, likely from the previous CT scan. There has been interval postoperative change since the previous study. Changes may represent postoperative ileus although residual or recurrent obstruction is not excluded. Small amount of mesenteric edema. Prominent lymph nodes in the mesenteric without pathologic enlargement likely represent reactive nodes. Colon is not abnormally distended. No fluid collections to suggest abscess. No contrast extravasation. Vascular/Lymphatic: No significant vascular findings are present. No enlarged abdominal or pelvic lymph nodes. Reproductive: Uterus and ovaries are not enlarged. Small amount of free fluid in the pelvis is likely physiologic. Other: A few tiny gas bubbles are demonstrated in the upper abdomen, likely postoperative. Postoperative changes in the anterior abdominal wall with skin  clips and linear infiltration along the midline. Small amount of edema in the anterior subcutaneous fat. Musculoskeletal: No acute or significant osseous findings. IMPRESSION: Recent postoperative changes including partial colonic resection. Persistent small bowel dilatation with fluid-filled loops may represent postoperative ileus although residual or recurrent obstruction not excluded. 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 Gas in the bladder may be due to instrumentation or other bladder path biology. These results were called by telephone at the time of interpretation on 03/23/2016 at 6:00 am to Dr. Lucretia Roers, who verbally acknowledged these results. Electronically Signed   By: Burman Nieves M.D.   On: 03/23/2016 06:14   Dg Chest Portable 1 View  Result Date: 03/17/2016 CLINICAL DATA:  NG tube placement, having difficulty advancing. Pt being admitted for intussusception; acute onset of right upper quadrant abdominal pain with nausea and vomiting since yesterday. Hx- HTN EXAM: PORTABLE CHEST 1 VIEW COMPARISON:  03/02/2016 FINDINGS: Nasogastric tube has been placed, tip in the right mainstem bronchus. Cardiomediastinal silhouette is normal. No pneumothorax. IMPRESSION: Nasogastric tube in  the right mainstem bronchus. Critical Value/emergent results were called by telephone at the time of interpretation on 03/17/2016 at 3:09 pm to Dr. Dionne Milo , who verbally acknowledged these results. The nasogastric tube has been removed and replaced. Electronically Signed   By: Norva Pavlov M.D.   On: 03/17/2016 15:11    Assessment and plan- Patient is a 41 y.o. female who was admitted for abdominal intussusception possibly secondary to inflammatory bowel disease found to have acute portal venous thrombosis  1. I reviewed the patient's prior CT scans (without contrast)and ultrasound which did not reveal any evidence of portal venous thrombosis. Given the presence of an acute  inflammation from possible IBD, recent surgery for intussusception and findings of portal venous thrombosis on CT scan from today, findings are concerning for an acute portal venous thrombosis. CT scan on admission was done without contrast and it is difficult to say whether she had it on the day of admission. Regardless patient needs anticoagulation with Lovenox 1 mg/kg subcutaneously twice a day. I would keep her on this for at least 2 weeks and see her as an outpatient and decide about switching her to an oral anticoagulant at that time. I would recommend 3-6 months of therapeutic anticoagulation based on how well her IBD is controlled. As long as she has active bowel inflammation, she remains at a pro- thrombotic state. Patient has been afebrile. She is not tachycardic and her white count was normal and therefore I do not feel that she has an acute septic portal venous thrombosis.   Total face to face encounter time for this patient visit was 30 min. >50% of the time was  spent in counseling and coordination of care.     Thank you for this kind referral and the opportunity to participate in the care of this patient   Visit Diagnosis 1. Epigastric pain   2. Abdominal distention   3. Abdominal pain     Dr. Owens Shark, MD, MPH [facility] Pager- [phone] 03/23/2016 12:53 PM

## 2016-03-24 MED ORDER — FLEET ENEMA 7-19 GM/118ML RE ENEM
1.0000 | ENEMA | Freq: Every day | RECTAL | Status: DC
  Administered 2016-03-24 – 2016-03-25 (×2): 1 via RECTAL

## 2016-03-24 NOTE — Progress Notes (Signed)
03/24/2016  Subjective: Patient is 7 Days Post-Op s/p exploratory laparotomy with right hemicolectomy for small bowel obstruction.  No acute events overnight.  Hematology evaluated the patient and recommended Lovenox therapeutic dose BID.  Pain appears better controlled.  Patient had a small bowel movement last night, though NG tube remains high at 850 ml for 24 hrs.  Vital signs: Temp:  [98.1 F (36.7 C)-98.3 F (36.8 C)] 98.3 F (36.8 C) (02/10 0515) Pulse Rate:  [72-92] 91 (02/10 0515) Resp:  [19] 19 (02/09 2106) BP: (123-134)/(83-89) 128/83 (02/10 0515) SpO2:  [93 %-100 %] 99 % (02/10 0515)   Intake/Output: 02/09 0701 - 02/10 0700 In: 1846.3 [I.V.:1846.3] Out: 1050 [Urine:200; Emesis/NG output:850] Last BM Date: 03/22/16  Physical Exam: Constitutional: No acute distress Abdomen:  Soft, nondistended, improved tenderness to palpation.  Midline incision is clean, dry, intact with staples.  No evidence of infection.  Labs:   Recent Labs  03/22/16 0458 03/23/16 0445  WBC 4.0 5.1  HGB 10.0* 10.9*  HCT 29.9* 31.7*  PLT 301 342    Recent Labs  03/23/16 0445  NA 133*  K 3.9  CL 98*  CO2 27  GLUCOSE 125*  BUN 8  CREATININE 0.55  CALCIUM 8.9   No results for input(s): LABPROT, INR in the last 72 hours.  Imaging: No results found.  Assessment/Plan: 41 yo female s/p exploratory laparotomy with right hemicolectomy.  --Continue NPO with IV fluid hydration and NG tube to suction today. --Start enemas today for bowel regimen. --continue therapeutic lovenox for portal vein thrombosis --Awaiting return of full bowel function.  May need to consider TPN if no improvement by 2/12 per nutrition recs.   Howie Ill, MD [facility]

## 2016-03-25 LAB — CBC
HCT: 28.9 % — ABNORMAL LOW (ref 35.0–47.0)
Hemoglobin: 9.7 g/dL — ABNORMAL LOW (ref 12.0–16.0)
MCH: 27.8 pg (ref 26.0–34.0)
MCHC: 33.6 g/dL (ref 32.0–36.0)
MCV: 82.7 fL (ref 80.0–100.0)
Platelets: 319 10*3/uL (ref 150–440)
RBC: 3.49 MIL/uL — ABNORMAL LOW (ref 3.80–5.20)
RDW: 15.7 % — ABNORMAL HIGH (ref 11.5–14.5)
WBC: 6.1 10*3/uL (ref 3.6–11.0)

## 2016-03-25 MED ORDER — POLYETHYLENE GLYCOL 3350 17 G PO PACK
17.0000 g | PACK | Freq: Every day | ORAL | Status: DC
  Administered 2016-03-25: 17 g via ORAL
  Filled 2016-03-25: qty 1

## 2016-03-25 NOTE — Progress Notes (Signed)
NG tube removed; output  Decreased; patient tolerated removal of NG tube without difficulty; Will continue to monitor

## 2016-03-25 NOTE — Progress Notes (Signed)
03/25/2016  Subjective: Patient is 8 Days Post-Op status post exploratory laparotomy with right hemicolectomy for small bowel obstruction. No acute events overnight. She did have another bowel movement with the use of an enema. Reports her pain is doing better this morning and feels some rumbling in her lower abdomen. NG tube output has been decreasing over the last couple of days.  Vital signs: Temp:  [98 F (36.7 C)-98.8 F (37.1 C)] 98.6 F (37 C) (02/11 0722) Pulse Rate:  [72-87] 73 (02/11 0722) Resp:  [17-18] 18 (02/11 0522) BP: (117-126)/(80-90) 122/86 (02/11 0722) SpO2:  [98 %-100 %] 100 % (02/11 0722)   Intake/Output: 02/10 0701 - 02/11 0700 In: 1527.5 [I.V.:1497.5; NG/GT:30] Out: 600 [Emesis/NG output:600] Last BM Date: 03/24/16  Physical Exam: Constitutional: No acute distress Abdomen:  Soft, nondistended, with much improved tenderness to palpation over the midline incision. Incision is clean dry and intact with no evidence of infection. Staples in place. NG tube with gastric contents in canister.  Labs:   Recent Labs  03/23/16 0445 03/25/16 0434  WBC 5.1 6.1  HGB 10.9* 9.7*  HCT 31.7* 28.9*  PLT 342 319    Recent Labs  03/23/16 0445  NA 133*  K 3.9  CL 98*  CO2 27  GLUCOSE 125*  BUN 8  CREATININE 0.55  CALCIUM 8.9   No results for input(s): LABPROT, INR in the last 72 hours.  Imaging: No results found.  Assessment/Plan: 41 year old female status post exploratory laparotomy with right hemicolectomy.  -We'll attempt NG tube clamp trial this morning. If the residual is less than 150 mL we will discontinue the NG tube today. -Continue daily enemas. -Continue therapeutic Lovenox for her portal vein thrombosis. -If unable to remove NG tube today, we'll have to strongly consider PICC line and TPN starting tomorrow due to lack of nutrition over last week.   Howie Ill, MD [facility]

## 2016-03-26 LAB — BASIC METABOLIC PANEL
Anion gap: 6 (ref 5–15)
BUN: <5 mg/dL — ABNORMAL LOW (ref 6–20)
CO2: 28 mmol/L (ref 22–32)
Calcium: 8.2 mg/dL — ABNORMAL LOW (ref 8.9–10.3)
Chloride: 102 mmol/L (ref 101–111)
Creatinine, Ser: 0.63 mg/dL (ref 0.44–1.00)
GFR calc Af Amer: >60 mL/min (ref >60)
GFR calc non Af Amer: >60 mL/min (ref >60)
Glucose, Bld: 104 mg/dL — ABNORMAL HIGH (ref 65–99)
Potassium: 4 mmol/L (ref 3.5–5.1)
Sodium: 136 mmol/L (ref 135–145)

## 2016-03-26 LAB — CBC
HCT: 28.6 % — ABNORMAL LOW (ref 35.0–47.0)
Hemoglobin: 10 g/dL — ABNORMAL LOW (ref 12.0–16.0)
MCH: 28.5 pg (ref 26.0–34.0)
MCHC: 34.8 g/dL (ref 32.0–36.0)
MCV: 81.9 fL (ref 80.0–100.0)
Platelets: 337 10*3/uL (ref 150–440)
RBC: 3.49 MIL/uL — ABNORMAL LOW (ref 3.80–5.20)
RDW: 15.7 % — ABNORMAL HIGH (ref 11.5–14.5)
WBC: 6.7 10*3/uL (ref 3.6–11.0)

## 2016-03-26 LAB — MAGNESIUM: Magnesium: 2.1 mg/dL (ref 1.7–2.4)

## 2016-03-26 LAB — INFLAMMATORY BOWEL DISEASE-IBD
Atypical P-ANCA titer: <1:20 {titer}
Saccharomyces cerevisiae, IgA: <20 Units (ref 0.0–24.9)
Saccharomyces cerevisiae, IgG: <20 Units (ref 0.0–24.9)

## 2016-03-26 MED ORDER — RIVAROXABAN (XARELTO) EDUCATION KIT FOR DVT/PE PATIENTS: 1.0000 | PACK | Freq: Once | 0 refills | Status: DC

## 2016-03-26 MED ORDER — OXYCODONE-ACETAMINOPHEN 5-325 MG PO TABS
1.0000 | ORAL_TABLET | ORAL | Status: DC | PRN
  Administered 2016-03-26: 1 via ORAL
  Filled 2016-03-26: qty 1

## 2016-03-26 MED ORDER — RIVAROXABAN (XARELTO) EDUCATION KIT FOR DVT/PE PATIENTS: PACK | Freq: Once | Status: DC

## 2016-03-26 MED ORDER — RIVAROXABAN (XARELTO) VTE STARTER PACK (15 & 20 MG): ORAL_TABLET | ORAL | 1 refills | Status: DC

## 2016-03-26 MED ORDER — RIVAROXABAN 15 MG PO TABS: 15.0000 mg | ORAL_TABLET | Freq: Two times a day (BID) | ORAL | Status: DC

## 2016-03-26 MED ORDER — KETOROLAC TROMETHAMINE 30 MG/ML IJ SOLN
30.0000 mg | Freq: Four times a day (QID) | INTRAMUSCULAR | Status: DC
  Filled 2016-03-26: qty 1

## 2016-03-26 MED ORDER — OXYCODONE-ACETAMINOPHEN 5-325 MG PO TABS: 1.0000 | ORAL_TABLET | ORAL | 0 refills | Status: DC | PRN

## 2016-03-26 MED ORDER — ENOXAPARIN SODIUM 150 MG/ML ~~LOC~~ SOLN: 60.0000 mg | Freq: Two times a day (BID) | SUBCUTANEOUS | 0 refills | Status: DC

## 2016-03-26 NOTE — Progress Notes (Signed)
IV and staples were removed without difficulty. Discharge instructions, follow-up appointments, and prescriptions were provided to the pt. All questions were answered. The pt was taken downstairs via wheelchair by volunteer services.

## 2016-03-26 NOTE — Discharge Summary (Signed)
Patient ID: BRITTISH BOLINGER MRN: [id number] DOB/AGE: Oct 27, 1975 41 y.o.  Admit date: 03/17/2016 Discharge date: 03/26/2016   Discharge Diagnoses:  Active Problems:   SBO (small bowel obstruction)   Thrombosis, portal vein   Procedures: Laparotomy and Right hemicolectomy  Hospital Course: Is a 41 year old female seen in the emergency room complaining of abdominal pain nausea and vomiting. CT scan U by my colleague Dr. Excell Seltzer showing potential intussusception and a bowel obstruction. On her initial exam sheet. Presented with an acute abdomen and the decision was made to take the patient for a laparotomy. 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 Dr. Excell Seltzer resected the right colon as well. He was inconclusive and potentially suggestive of Crohn's disease. And GI was consulted and they were not necessarily convinced that she had Crohn's. She will have a follow-up with Dr. wall as an outpatient. 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 She was started appropriately on Lovenox for anticoagulation and hematology was consulted. They recommended outpatient therapy with Lovenox and also they will see her as an outpatient. At the time of discharge she was ambulating, tolerating regular diet and feeling well. Her vital signs were stable and she was afebrile. Her physical exam shows a female in no acute distress, awake, alert. Abdomen: Soft staples in place without evidence of infection. Appropriate tenderness to palpation. Extremities: No edema and well perfused. Condition of patient time of discharge is stable. I have asked nurses to remove the staples today and also provide Lovenox teaching .  Consults: GI, Hematology  Disposition: 01-Home or Self Care  Discharge Instructions    Call MD for:  difficulty breathing, headache or  visual disturbances    Complete by:  As directed    Call MD for:  extreme fatigue    Complete by:  As directed    Call MD for:  hives    Complete by:  As directed    Call MD for:  persistant dizziness or light-headedness    Complete by:  As directed    Call MD for:  persistant nausea and vomiting    Complete by:  As directed    Call MD for:  redness, tenderness, or signs of infection (pain, swelling, redness, odor or green/yellow discharge around incision site)    Complete by:  As directed    Call MD for:  severe uncontrolled pain    Complete by:  As directed    Call MD for:  temperature >100.4    Complete by:  As directed    Diet - low sodium heart healthy    Complete by:  As directed    Discharge instructions    Complete by:  As directed    Please make sure pt starts taking Xarelto Tomorrow am. DC after staples are removed and PM xarelto dose is given   Increase activity slowly    Complete by:  As directed    Lifting restrictions    Complete by:  As directed    20 lbs x 6 wks     Allergies as of 03/26/2016      Reactions   Codeine    Other reaction(s): Unknown   Iodine Hives      Medication List    TAKE these medications   carvedilol 6.25 MG tablet Commonly known as:  COREG Take  1 tablet (6.25 mg total) by mouth 2 (two) times daily.   enoxaparin 150 MG/ML injection Commonly known as:  LOVENOX Inject 0.4 mLs (60 mg total) into the skin every 12 (twelve) hours.   ibuprofen 200 MG tablet Commonly known as:  ADVIL,MOTRIN Take 200 mg by mouth every 6 (six) hours as needed.   lisinopril 5 MG tablet Commonly known as:  PRINIVIL,ZESTRIL Take 1 tablet (5 mg total) by mouth daily.   multivitamin tablet Take 1 tablet by mouth daily.   oxyCODONE-acetaminophen 5-325 MG tablet Commonly known as:  PERCOCET/ROXICET Take 1-2 tablets by mouth every 4 (four) hours as needed for moderate pain.   traMADol 50 MG tablet Commonly known as:  ULTRAM Take 50 mg by mouth every 6  (six) hours as needed.      Follow-up Information    Dionne Milo, MD Follow up in 2 week(s).   Specialty:  Surgery Contact information: [address] [place] [address] [phone]            Sterling Big, MD FACS

## 2016-03-26 NOTE — Progress Notes (Signed)
  Midge Minium, MD [facility]   [address], [address] [place], [place] [address] Phone: [phone] Fax : [phone]   Subjective: This patient is status post right hemicolectomy. The patient states that she is needing less pain medication at this time. She also reports that she is tolerating her advancement in diet. The patient does feel like her bowels have started to cramp up and move. No nausea or vomiting. The IBD panel is still pending.   Objective: Vital signs in last 24 hours: Vitals:   03/25/16 1300 03/25/16 2011 03/26/16 0506 03/26/16 0746  BP: 108/83 133/80 112/79 109/82  Pulse: 88 94 69 71  Resp: 18 18  18   Temp: 98.5 F (36.9 C) 98.8 F (37.1 C) 98.4 F (36.9 C) 98.6 F (37 C)  TempSrc: Oral Oral Oral Oral  SpO2: 100% 100% 100% 97%  Weight:      Height:       Weight change:   Intake/Output Summary (Last 24 hours) at 03/26/16 1116 Last data filed at 03/26/16 1031  Gross per 24 hour  Intake             3733 ml  Output              151 ml  Net             3582 ml     Exam: Heart:: Regular rate and rhythm, without murmurs rubs or gallops Lungs: normal, clear to auscultation bilaterally Abdomen: soft, mildly tender with decreased bowel sounds   Lab Results: @LABTEST2 @ Micro Results: No results found for this or any previous visit (from the past 240 hour(s)). Studies/Results: No results found. Medications: I have reviewed the patient's current medications. Scheduled Meds: . cyclobenzaprine  5 mg Oral TID  . enoxaparin (LOVENOX) injection  1 mg/kg Subcutaneous Q12H  . ketorolac  30 mg Intravenous Q6H  . pantoprazole (PROTONIX) IV  40 mg Intravenous Q24H  . polyethylene glycol  17 g Oral Daily  . sodium phosphate  1 enema Rectal Daily   Continuous Infusions: . dextrose 5 % and 0.45 % NaCl with KCl 20 mEq/L 75 mL/hr at 03/26/16 1032   PRN Meds:.HYDROmorphone (DILAUDID) injection, LORazepam, ondansetron **OR** ondansetron (ZOFRAN) IV,  oxyCODONE-acetaminophen, phenol   Assessment: Active Problems:   SBO (small bowel obstruction)   Thrombosis, portal vein    Plan: This patient is status post right hemicolectomy with slightly better bowel sounds today. The patient was found to have portal vein thrombosis and is being seen by hematology for that. Inflamed her bowel disease is a hypercoagulable state and can cause this patient to have portal venous thrombosis. The pathology was not completely consistent with this and we are waiting to receive the IBD panel that was sent off on this patient. The patient should follow up as an outpatient.   LOS: 9 days   Nikaya Nasby 03/26/2016, 11:16 AM

## 2016-03-26 NOTE — Progress Notes (Signed)
Patient is 8 Days Post-Op status post exploratory laparotomy with right hemicolectomy for small bowel obstruction Taking clears and passing gas AVSS  PE NAD Abd: staples in place, mild distension. appropriate incisional tenderness , no peritonitis  A/P Doing well Advance diet PO percocet and Toradol DC either later today or tomorrow

## 2016-03-27 ENCOUNTER — Telehealth: Payer: Self-pay | Admitting: *Deleted

## 2016-03-27 ENCOUNTER — Telehealth: Payer: Self-pay

## 2016-03-27 MED ORDER — ENOXAPARIN SODIUM 150 MG/ML ~~LOC~~ SOLN: 60.0000 mg | Freq: Two times a day (BID) | SUBCUTANEOUS | 0 refills | Status: DC

## 2016-03-27 NOTE — Telephone Encounter (Signed)
Patient's disability form was filled out and faxed. 

## 2016-03-27 NOTE — Telephone Encounter (Signed)
Patient called in with questions regarding Lovenox from pharmacy. She states that she received a call from the Pharmacist that something is wrong with the prescription. I explained that I have not received a phone call but would get to the bottom of this.  Call made to [facility] at this time. Spoke with Pharmacist, Arnette Norris. She explained that the dosage needed to be clarified. Verbal order was given for correct dosage of 60mg . She states that she will discuss this with patient's insurance.    Thoretta returned phone call and states that Medication at [facility] will be $981.00.  Spoke with Dr. Everlene Farrier at this time whom informed me that patient is to pick up prescription for Enoxaprin at [facility] with a coupon given by Case Manager at the hospital.   Call was made to [facility] at [facility] and explained information above. Prescription at [facility] has been cancelled.  Lovenox was sent in to [facility] in [place] to be able to process the coupon.

## 2016-03-27 NOTE — Telephone Encounter (Signed)
Insurance is not coevering her anticoagulation med and she is asking if she needs to com e to the office for a Lovenox injection Until "they " can get it worked out. States the hospital is working on it. Please advise

## 2016-03-28 ENCOUNTER — Telehealth: Payer: Self-pay | Admitting: *Deleted

## 2016-03-28 NOTE — Telephone Encounter (Signed)
Called pt in the evening yest. Trying to help her with lovenox rx. She had no more injections from hosp. Her insurance was not paying for it at first but then after investigation from [place] the triage nurse it required PA that had never been sent to the ordering MD office and [employer]one let office know it required prior auth.  I called pharmacy and it only required PA because it was over the amount that insurance would pay for. The dose was written 150 mg but she was to take .4 ml bid. The pharmacy was out of 150, they did not have 60 mg because that is the dose pt needs.  They only carried 40 and 100 mg in stock.  Asked the pharmacist to give pt 100 mg syringes and dose of .6 ml and Kaitlyn was fine with that because the dose did not change and she ran it with insurance and 10 syringes would cost 161 dollars and they will try using good rx coupon to see if that is cheaper with pt.  Today when I called to check on the patient she did fine with the injection and waisting the correct amount and I told her that the doctor was going to see her this week so we can help with changing to oral med and she was agreeable to coming in Friday and I gave her the appt for 9 am. I told her we can get lovenox 60 mg for 5 more days from medication management and it is free drug and she is agreeable to this.

## 2016-03-28 NOTE — Telephone Encounter (Signed)
There is telephone note in for this pt about what we are doing to help her

## 2016-03-28 NOTE — Telephone Encounter (Signed)
Spoke with Pharmacist, Jamse Belfast at [facility]. He states that the prescription has been picked up by patient last evening.

## 2016-03-30 ENCOUNTER — Encounter: Payer: Self-pay | Admitting: Oncology

## 2016-03-30 ENCOUNTER — Inpatient Hospital Stay: Payer: BLUE CROSS/BLUE SHIELD | Attending: Oncology | Admitting: Oncology

## 2016-03-30 ENCOUNTER — Ambulatory Visit: Payer: BLUE CROSS/BLUE SHIELD | Admitting: Cardiovascular Disease

## 2016-03-30 VITALS — BP 119/89 | HR 99 | Temp 97.4°F | Wt 118.6 lb

## 2016-03-30 DIAGNOSIS — I10 Essential (primary) hypertension: Secondary | ICD-10-CM

## 2016-03-30 DIAGNOSIS — Z79899 Other long term (current) drug therapy: Secondary | ICD-10-CM | POA: Diagnosis not present

## 2016-03-30 DIAGNOSIS — K5090 Crohn's disease, unspecified, without complications: Secondary | ICD-10-CM | POA: Diagnosis not present

## 2016-03-30 DIAGNOSIS — R0602 Shortness of breath: Secondary | ICD-10-CM | POA: Diagnosis not present

## 2016-03-30 DIAGNOSIS — Z8719 Personal history of other diseases of the digestive system: Secondary | ICD-10-CM

## 2016-03-30 DIAGNOSIS — K529 Noninfective gastroenteritis and colitis, unspecified: Secondary | ICD-10-CM | POA: Diagnosis not present

## 2016-03-30 DIAGNOSIS — I81 Portal vein thrombosis: Secondary | ICD-10-CM

## 2016-03-30 DIAGNOSIS — Z8669 Personal history of other diseases of the nervous system and sense organs: Secondary | ICD-10-CM | POA: Diagnosis not present

## 2016-03-30 DIAGNOSIS — F419 Anxiety disorder, unspecified: Secondary | ICD-10-CM | POA: Insufficient documentation

## 2016-03-30 NOTE — Progress Notes (Signed)
Patient denies pain at this time, but states she's having some incisional pain from surgery but nothing she can't tolerate.  HR 100 and documented.  Patient brought to exam room 4

## 2016-03-30 NOTE — Progress Notes (Signed)
Hematology/Oncology Consult note [facility]  [phone] [phone]  Patient Care Team: Lyndon Code, MD as PCP - General (Internal Medicine) Lyndon Code, MD (Internal Medicine)   Name of the patient: Chelsea Carroll  [id number]  1975-12-12   Date of visit: 03/30/16  Diagnosis- acute portal venous thrombosis  Chief complaint/ Reason for visit- post hospital discharge f/u  Heme/Onc history: patient is a 41 year old female with a possible history of Crohn's disease in the past. She has seen Dr. Mechele Collin [facility] and has not benign treatment recently. She presented to the hospital on 03/17/2016 with acute abdominal pain. CT abdomen and pelvis done without contrast showed acute small bowel obstruction with transition point in the pelvic small bowel where there appears to be enteroenteric intussusception. Mildly inflamed appearing distal small bowel. Patient was taken to the OR and underwent extremity laparotomy and right hemicolectomy. Pathology showed acute enteritis but features that were not entirely compatible with IBD. Post op day 2 patient had repeat abdominal pain and repeat CT abdomen showed portal venous thrombosis. Patient was started on lovenox  Interval history- patient had problems getting Lovenox as an outpatient because of insurance issues but currently does have about 5 days worth of twice a day Lovenox. She is interested in starting oral anticoagulant at this time. Continues to have diarrhea. Abdominal pain is better. She is mainly eating soft foods. She is due to see GI in 10 days' time  ECOG PS- 1 Pain scale- 3  Review of systems- Review of Systems  Constitutional: Negative for chills, fever, malaise/fatigue and weight loss.  HENT: Negative for congestion, ear discharge and nosebleeds.   Eyes: Negative for blurred vision.  Respiratory: Negative for cough, hemoptysis, sputum production, shortness of  breath and wheezing.   Cardiovascular: Negative for chest pain, palpitations, orthopnea and claudication.  Gastrointestinal: Positive for abdominal pain and diarrhea. Negative for blood in stool, constipation, heartburn, melena, nausea and vomiting.  Genitourinary: Negative for dysuria, flank pain, frequency, hematuria and urgency.  Musculoskeletal: Negative for back pain, joint pain and myalgias.  Skin: Negative for rash.  Neurological: Negative for dizziness, tingling, focal weakness, seizures, weakness and headaches.  Endo/Heme/Allergies: Does not bruise/bleed easily.  Psychiatric/Behavioral: Negative for depression and suicidal ideas. The patient does not have insomnia.      Current treatment- lovenox  Allergies  Allergen Reactions  . Codeine     Other reaction(s): Unknown  . Iodine Hives     Past Medical History:  Diagnosis Date  . Hypertension   . Migraines      Past Surgical History:  Procedure Laterality Date  . LAPAROTOMY N/A 03/17/2016   Procedure: EXPLORATORY LAPAROTOMY,right colon resection.terminal resection,appendectomy;  Surgeon: Lattie Haw, MD;  Location: [facility] ORS;  Service: General;  Laterality: N/A;    Social History   Social History  . Marital status: Married    Spouse name: N/A  . Number of children: N/A  . Years of education: N/A   Occupational History  . Not on file.   Social History Main Topics  . Smoking status: Never Smoker  . Smokeless tobacco: Never Used  . Alcohol use Yes     Comment: occasional  . Drug use: No  . Sexual activity: Not on file   Other Topics Concern  . Not on file   Social History Narrative  . No narrative on file    Family History  Problem Relation Age of Onset  .  Hyperlipidemia Mother   . Hypertension Mother      Current Outpatient Prescriptions:  .  carvedilol (COREG) 6.25 MG tablet, Take 1 tablet (6.25 mg total) by mouth 2 (two) times daily., Disp: 60 tablet, Rfl: 11 .  enoxaparin (LOVENOX) 150  MG/ML injection, Inject 0.4 mLs (60 mg total) into the skin every 12 (twelve) hours., Disp: 40 Syringe, Rfl: 0 .  ibuprofen (ADVIL,MOTRIN) 200 MG tablet, Take 200 mg by mouth every 6 (six) hours as needed., Disp: , Rfl:  .  lisinopril (PRINIVIL,ZESTRIL) 2.5 MG tablet, Take 1 tablet by mouth daily., Disp: , Rfl:  .  lisinopril (PRINIVIL,ZESTRIL) 5 MG tablet, Take 1 tablet (5 mg total) by mouth daily., Disp: 30 tablet, Rfl: 5 .  Multiple Vitamin (MULTIVITAMIN) tablet, Take 1 tablet by mouth daily., Disp: , Rfl:  .  nortriptyline (PAMELOR) 10 MG capsule, Take 1 capsule by mouth daily., Disp: , Rfl:  .  oxyCODONE-acetaminophen (PERCOCET/ROXICET) 5-325 MG tablet, Take 1-2 tablets by mouth every 4 (four) hours as needed for moderate pain., Disp: 30 tablet, Rfl: 0 .  traMADol (ULTRAM) 50 MG tablet, Take 50 mg by mouth every 6 (six) hours as needed., Disp: , Rfl:   Physical exam:  Vitals:   03/30/16 0917  BP: 119/89  Pulse: 99  Temp: 97.4 F (36.3 C)  TempSrc: Temporal  Weight: 118 lb 9.7 oz (53.8 kg)   Physical Exam  Constitutional: She is oriented to person, place, and time and well-developed, well-nourished, and in no distress.  HENT:  Head: Normocephalic and atraumatic.  Eyes: EOM are normal. Pupils are equal, round, and reactive to light.  Neck: Normal range of motion.  Cardiovascular: Normal rate, regular rhythm and normal heart sounds.   Pulmonary/Chest: Effort normal and breath sounds normal.  Abdominal: Soft. Bowel sounds are normal.  Midline scar of exploratory laparotomy as well as a post and there is no sign of infection or inflammation.  Neurological: She is alert and oriented to person, place, and time.  Skin: Skin is warm and dry.     CMP Latest Ref Rng & Units 03/26/2016  Glucose 65 - 99 mg/dL 161(W)  BUN 6 - 20 mg/dL <9(U)  Creatinine 0.45 - 1.00 mg/dL 4.09  Sodium 811 - 914 mmol/L 136  Potassium 3.5 - 5.1 mmol/L 4.0  Chloride 101 - 111 mmol/L 102  CO2 22 - 32 mmol/L  28  Calcium 8.9 - 10.3 mg/dL 8.2(L)  Total Protein 6.5 - 8.1 g/dL -  Total Bilirubin 0.3 - 1.2 mg/dL -  Alkaline Phos 38 - 782 U/L -  AST 15 - 41 U/L -  ALT 14 - 54 U/L -   CBC Latest Ref Rng & Units 03/26/2016  WBC 3.6 - 11.0 K/uL 6.7  Hemoglobin 12.0 - 16.0 g/dL 10.0(L)  Hematocrit 35.0 - 47.0 % 28.6(L)  Platelets 150 - 440 K/uL 337    No images are attached to the encounter.  Ct Abdomen Pelvis Wo Contrast  Result Date: 03/17/2016 CLINICAL DATA:  41 year old female with intermittent epigastric, abdominal and pelvic pain since yesterday. Initial encounter. EXAM: CT ABDOMEN AND PELVIS WITHOUT CONTRAST TECHNIQUE: Multidetector CT imaging of the abdomen and pelvis was performed following the standard protocol without IV contrast. COMPARISON:  Noncontrast CT Abdomen and Pelvis 10/16/2007. Right upper quadrant ultrasound 04/27/2015. FINDINGS: Lower chest: Minor atelectasis or scarring in the medial segment of the right middle lobe, otherwise negative. No pericardial or pleural effusion. Hepatobiliary: Negative noncontrast liver. The gallbladder is mildly distended  but otherwise negative. Pancreas: Negative. Spleen: Negative. No abdominal free air or free fluid. Adrenals/Urinary Tract: Negative adrenal glands. Negative noncontrast kidneys. No hydroureter. Stomach/Bowel: Negative rectosigmoid colon aside from some retained stool. Moderate volume of retained stool throughout the left colon, transverse and right colon. Normal appendix (coronal image 30). The terminal ileum is decompressed (series 2, image 58), but just upstream beginning in the pelvis there are dilated fluid-filled small bowel loops throughout the remaining abdomen. There a fairly discrete transition point on series 5, image 38, in there appears to be a small bowel to small bowel intussusception at this transition. No discrete small bowel mass. No discrete Meckel diverticulum. The leading edge of oral contrast has almost reached the dilated  pelvic loops. Small bowel loops measure up to 30 mm diameter, and the most distal dilated loops appear indistinct with mild mesenteric stranding (coronal image 19). The proximal jejunum is normal. The duodenum and stomach are normal. Vascular/Lymphatic: No intravenous contrast. Vascular patency is not evaluated in the absence of IV contrast. No lymphadenopathy. Reproductive: Negative. Other: Small volume pelvic free fluid, mostly in the cul-de-sac. Musculoskeletal: No acute osseous abnormality identified. IMPRESSION: 1. Acute Small Bowel Obstruction with transition point in the pelvic small bowel where there appears to be an Enteroenteric Intussusception. Adult intussusception is usually due to a pathologically lead point. In this area of distal small bowel consider a Meckel's diverticulum or occult small bowel mass. 2. Mildly inflamed appearing distal loops of obstruct the small bowel. Trace pelvic free fluid. No abdominal free fluid. No free air. Electronically Signed   By: Odessa Fleming M.D.   On: 03/17/2016 13:17   Dg Chest 2 View  Result Date: 03/02/2016 CLINICAL DATA:  Chest pain EXAM: CHEST  2 VIEW COMPARISON:  December 25, 2013 FINDINGS: Lungs are clear. Heart size and pulmonary vascularity are normal. No adenopathy. No pneumothorax. There is upper thoracic levoscoliosis. IMPRESSION: No edema or consolidation. Electronically Signed   By: Bretta Bang III M.D.   On: 03/02/2016 11:21   Dg Abd 1 View  Result Date: 03/21/2016 CLINICAL DATA:  Abdominal distention, 3 days postop from exploratory laparotomy with right hemicolectomy due to small-bowel obstruction EXAM: ABDOMEN - 1 VIEW COMPARISON:  CT abdomen pelvis of 03/17/2016 FINDINGS: There is a small amount of contrast throughout the nondistended colon from recent CT of the abdomen. There is persistent slight dilatation of several loops of small bowel which could indicate persistent small-bowel obstruction versus ileus postoperatively. No opaque calculi  is seen. NG tube tip is in the fundus of the stomach. IMPRESSION: 1. Still slight gaseous distention of small bowel loops which may indicate ileus or persistent partial small bowel obstruction. 2. NG tube tip in the fundus of the stomach. Electronically Signed   By: Dwyane Dee M.D.   On: 03/21/2016 08:54   Ct Abdomen Pelvis W Contrast  Result Date: 03/23/2016 CLINICAL DATA:  Severe crampy abdominal pain. Recent laparotomy 03/17/2016. History of intestinal problems since age 4. Possible Crohn's disease. Right hemicolectomy. EXAM: CT ABDOMEN AND PELVIS WITH CONTRAST TECHNIQUE: Multidetector CT imaging of the abdomen and pelvis was performed using the standard protocol following bolus administration of intravenous contrast. CONTRAST:  ISOVUE-300 IOPAMIDOL (ISOVUE-300) INJECTION 61% COMPARISON:  04/06/2016 FINDINGS: Lower chest: The lung bases are clear.  No esophageal distention. Hepatobiliary: Diffuse low attenuation change throughout the liver suggesting fatty infiltration. Filling defect in the distal main portal vein extending into the right branches suggesting portal venous thrombosis. No focal liver lesions  are identified. Gallbladder is distended without stone or wall thickening. No bile duct dilatation. Pancreas: Unremarkable. No pancreatic ductal dilatation or surrounding inflammatory changes. Spleen: Normal in size without focal abnormality. Adrenals/Urinary Tract: No adrenal gland nodules. Kidneys are symmetrical. No hydronephrosis or hydroureter. Bladder contains gas. This could result from catheterization, cystitis, or fistula. Correlation with urinalysis is recommended. No bladder wall thickening. Stomach/Bowel: Enteric tube with tip in the stomach. Stomach appears normal. Small bowel are dilated and fluid-filled. Contrast material is demonstrated in the colon, likely from the previous CT scan. There has been interval postoperative change since the previous study. Changes may represent  postoperative ileus although residual or recurrent obstruction is not excluded. Small amount of mesenteric edema. Prominent lymph nodes in the mesenteric without pathologic enlargement likely represent reactive nodes. Colon is not abnormally distended. No fluid collections to suggest abscess. No contrast extravasation. Vascular/Lymphatic: No significant vascular findings are present. No enlarged abdominal or pelvic lymph nodes. Reproductive: Uterus and ovaries are not enlarged. Small amount of free fluid in the pelvis is likely physiologic. Other: A few tiny gas bubbles are demonstrated in the upper abdomen, likely postoperative. Postoperative changes in the anterior abdominal wall with skin clips and linear infiltration along the midline. Small amount of edema in the anterior subcutaneous fat. Musculoskeletal: No acute or significant osseous findings. IMPRESSION: Recent postoperative changes including partial colonic resection. Persistent small bowel dilatation with fluid-filled loops may represent postoperative ileus although residual or recurrent obstruction not excluded. 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 Gas in the bladder may be due to instrumentation or other bladder path biology. These results were called by telephone at the time of interpretation on 03/23/2016 at 6:00 am to Dr. Lucretia Roers, who verbally acknowledged these results. Electronically Signed   By: Burman Nieves M.D.   On: 03/23/2016 06:14   Dg Chest Portable 1 View  Result Date: 03/17/2016 CLINICAL DATA:  NG tube placement, having difficulty advancing. Pt being admitted for intussusception; acute onset of right upper quadrant abdominal pain with nausea and vomiting since yesterday. Hx- HTN EXAM: PORTABLE CHEST 1 VIEW COMPARISON:  03/02/2016 FINDINGS: Nasogastric tube has been placed, tip in the right mainstem bronchus. Cardiomediastinal silhouette is normal. No pneumothorax. IMPRESSION: Nasogastric tube in  the right mainstem bronchus. Critical Value/emergent results were called by telephone at the time of interpretation on 03/17/2016 at 3:09 pm to Dr. Dionne Milo , who verbally acknowledged these results. The nasogastric tube has been removed and replaced. Electronically Signed   By: Norva Pavlov M.D.   On: 03/17/2016 15:11     Assessment and plan- Patient is a 41 y.o. female with acute portal venous thrombosis in the setting of acute enteritis  1. There were no features of chronicity such as collateral circulation noted on CT abdomen that would suggest that this was chronic portal venous thrombosis. Hence this was likely acute portal venous thromboses in the setting of acute enteritis the etiology of which is not clear. 4 portal venous thromboses I would recommend 6 months of anticoagulation and it would be reasonable to switch her to oral anticoagulation at this time. I did discuss risks and benefits of both Coumadin and new anticoagulants. Patient does not have any evidence of cirrhosis on imaging as well as labs and hence I feel that NOAC would be a reasonable option at this time. I did discuss the downside of Xarelto given that we do not have an FDA approved reversal agent although overall risk of  bleeding is lower as compared to Coumadin. Coumadin will require her to follow a consistent diet as well as regular PT INR checks although it would be readily reversible. Patient understands pros and cons of both and would like to proceed with Xarelto at this time. We have given her 1 months supply of xarelto while we figure out the co pay involved with her insurance. She will stop taking lovenox this evening and start taking xarelto with food 15 mg BID for 21 days followed by 20 mg once daily. I will see her back in 1 month's time with repeat CBC and cmp   Total face to face encounter time for this patient visit was 30 min. >50% of the time was  spent in counseling and coordination of care.     Visit  Diagnosis No diagnosis found.   Dr. Owens Shark, MD, MPH [facility] Pager- [phone] 03/30/2016 10:06 AM

## 2016-04-02 ENCOUNTER — Other Ambulatory Visit: Payer: Self-pay | Admitting: *Deleted

## 2016-04-02 ENCOUNTER — Telehealth: Payer: Self-pay | Admitting: *Deleted

## 2016-04-02 ENCOUNTER — Telehealth: Payer: Self-pay

## 2016-04-02 MED ORDER — RIVAROXABAN 20 MG PO TABS: 20.0000 mg | ORAL_TABLET | Freq: Every day | ORAL | 3 refills | Status: DC

## 2016-04-02 NOTE — Telephone Encounter (Signed)
Called to report that she is having blood in her stools, not feeling right and has a headache. Asking what to do.  Per VO Dr Orlie Dakin, she can come in for HGB check if she is feeling alright, if not, she should go to Urgent Care to be evaluated, does not feel there is correlation between HA and blood in stools. Attempted to return call to patient, no answer and VM is full so I can not leave a message.

## 2016-04-02 NOTE — Telephone Encounter (Signed)
I explained to Chelsea Carroll that the blood in the stool is normal as well as the diarrhea. I advised her to call us back if the blood starts leaking out and if her diarrhea increases to 6-8 times a day along with abdominal pain. Patient understood.

## 2016-04-02 NOTE — Telephone Encounter (Signed)
Chelsea Carroll had a Right colon resection with terminal ileum resection on 03/17/16 with Dr. Excell Seltzer. She was put on blood thinners, Lovenox, in the hospital then Dr. Smith Robert put her on Xarelto. She was put on the blood thinner because she had a blood clot in her liver. She saw blood in her stool yesterday and this morning the whole toilet was pink all around with the blood. She is still having a lot of diarrhea. She was feeling nauseous yesterday but that went away. She denies fever. She is going once or twice a day, it's very watery and grainy. Chelsea Carroll has an appointment on the 22 but wants to know if she should be concerned because of the blood.

## 2016-04-02 NOTE — Telephone Encounter (Signed)
Patient states she spoke with Dr Excell Seltzer surgeon who said this was nml post op and to just watch it for now. She will call later if she gets worse

## 2016-04-03 ENCOUNTER — Telehealth: Payer: Self-pay | Admitting: *Deleted

## 2016-04-03 NOTE — Telephone Encounter (Signed)
Called pt and got message but her voicemail is full and will nit accept any messages.  She had spoke to Dr. Orlie Dakin and he told her to come in and get hgb checked.  Will try calling back later or if pt comes in then I will enter orders for labs

## 2016-04-04 ENCOUNTER — Inpatient Hospital Stay: Payer: BLUE CROSS/BLUE SHIELD

## 2016-04-04 ENCOUNTER — Encounter (INDEPENDENT_AMBULATORY_CARE_PROVIDER_SITE_OTHER): Payer: Self-pay

## 2016-04-04 ENCOUNTER — Telehealth: Payer: Self-pay | Admitting: *Deleted

## 2016-04-04 DIAGNOSIS — Z7901 Long term (current) use of anticoagulants: Secondary | ICD-10-CM

## 2016-04-04 DIAGNOSIS — I81 Portal vein thrombosis: Secondary | ICD-10-CM

## 2016-04-04 LAB — CBC WITH DIFFERENTIAL/PLATELET
Basophils Absolute: 0.1 10*3/uL (ref 0–0.1)
Basophils Relative: 1 %
Eosinophils Absolute: 0.1 10*3/uL (ref 0–0.7)
Eosinophils Relative: 1 %
HCT: 28.1 % — ABNORMAL LOW (ref 35.0–47.0)
Hemoglobin: 9.3 g/dL — ABNORMAL LOW (ref 12.0–16.0)
Lymphocytes Relative: 30 %
Lymphs Abs: 1.9 10*3/uL (ref 1.0–3.6)
MCH: 27.4 pg (ref 26.0–34.0)
MCHC: 32.9 g/dL (ref 32.0–36.0)
MCV: 83.2 fL (ref 80.0–100.0)
Monocytes Absolute: 0.6 10*3/uL (ref 0.2–0.9)
Monocytes Relative: 10 %
Neutro Abs: 3.8 10*3/uL (ref 1.4–6.5)
Neutrophils Relative %: 58 %
Platelets: 394 10*3/uL (ref 150–440)
RBC: 3.38 MIL/uL — ABNORMAL LOW (ref 3.80–5.20)
RDW: 16 % — ABNORMAL HIGH (ref 11.5–14.5)
WBC: 6.5 10*3/uL (ref 3.6–11.0)

## 2016-04-04 NOTE — Telephone Encounter (Signed)
Per Dr Orlie Dakin check CBC

## 2016-04-04 NOTE — Telephone Encounter (Signed)
Called pt and told her that I did speak to Dr. B who is on call md today and he states to hold the evening dose of xarelto and then we will see pt in am . She is agreeable to the plan, appt 10:45 in am

## 2016-04-04 NOTE — Telephone Encounter (Signed)
Requesting to come in for lab check as offered the other day. She is not feeling well. Please advise

## 2016-04-05 ENCOUNTER — Ambulatory Visit (INDEPENDENT_AMBULATORY_CARE_PROVIDER_SITE_OTHER): Payer: BLUE CROSS/BLUE SHIELD | Admitting: Surgery

## 2016-04-05 ENCOUNTER — Encounter: Payer: Self-pay | Admitting: Surgery

## 2016-04-05 ENCOUNTER — Inpatient Hospital Stay (HOSPITAL_BASED_OUTPATIENT_CLINIC_OR_DEPARTMENT_OTHER): Payer: BLUE CROSS/BLUE SHIELD | Admitting: Oncology

## 2016-04-05 VITALS — BP 127/86 | HR 61 | Temp 98.5°F | Wt 118.4 lb

## 2016-04-05 VITALS — BP 134/86 | HR 114 | Temp 98.6°F | Ht 62.0 in | Wt 118.8 lb

## 2016-04-05 DIAGNOSIS — I81 Portal vein thrombosis: Secondary | ICD-10-CM

## 2016-04-05 DIAGNOSIS — K5090 Crohn's disease, unspecified, without complications: Secondary | ICD-10-CM | POA: Diagnosis not present

## 2016-04-05 DIAGNOSIS — K529 Noninfective gastroenteritis and colitis, unspecified: Secondary | ICD-10-CM | POA: Diagnosis not present

## 2016-04-05 DIAGNOSIS — I10 Essential (primary) hypertension: Secondary | ICD-10-CM | POA: Diagnosis not present

## 2016-04-05 DIAGNOSIS — R0602 Shortness of breath: Secondary | ICD-10-CM

## 2016-04-05 DIAGNOSIS — Z8669 Personal history of other diseases of the nervous system and sense organs: Secondary | ICD-10-CM

## 2016-04-05 DIAGNOSIS — Z79899 Other long term (current) drug therapy: Secondary | ICD-10-CM | POA: Diagnosis not present

## 2016-04-05 DIAGNOSIS — G8929 Other chronic pain: Secondary | ICD-10-CM

## 2016-04-05 DIAGNOSIS — Z8719 Personal history of other diseases of the digestive system: Secondary | ICD-10-CM

## 2016-04-05 DIAGNOSIS — F419 Anxiety disorder, unspecified: Secondary | ICD-10-CM

## 2016-04-05 DIAGNOSIS — R1031 Right lower quadrant pain: Secondary | ICD-10-CM

## 2016-04-05 MED ORDER — OXYCODONE-ACETAMINOPHEN 5-325 MG PO TABS: 1.0000 | ORAL_TABLET | Freq: Four times a day (QID) | ORAL | 0 refills | Status: DC | PRN

## 2016-04-05 NOTE — Progress Notes (Signed)
Outpatient postop visit  04/05/2016  Chelsea Carroll is an 41 y.o. female.    Procedure: Exploratory laparoscopy right colectomy  CC: Minimal abdominal pain and bloody stools  HPI: This patient who was believed to have an intussusception and was taken to the operating room emergently for an intussusception. At the time of surgery no intussusception was positively identified but a large mass of scar 5 bowel in the right lower quadrant involving the terminal ileum appendix and right colon were identified. A right hemicolectomy was performed with excision of the terminal ileum and appendix. A side to side hand sewn ileal colostomy was performed at the transverse colon.  She is now complaining of right lower quadrant pain which is minimal and improving but she's had multiple bloody stools to yesterday that she believes is secondary to the Xarelto. She is placed on Xarelto for portal vein thrombosis. She's been told that she needs to stay on that for 6 months but is seeing her internist later today.  She is eating well with no fevers or chills.  Medications reviewed.    Physical Exam:  BP 134/86   Pulse (!) 114   Temp 98.6 F (37 C) (Oral)   Ht 5\' 2"  (1.575 m)   Wt 118 lb 12.8 oz (53.9 kg)   BMI 21.73 kg/m     PE: Appears well. Vital signs are stable  Abdomen is soft and scaphoid nontender wound is clean no erythema no drainage nontender and no peritoneal signs    Assessment/Plan:  This a patient with an unusual presentation of possible intussusception which was not confirmed at surgery but a large scar 5 mass was identified. At the time of surgery my first consideration was that the patient had had a ruptured appendix at one point and this resulted in a scar 5 mass at the terminal ileum. However her pathology suggested inflammatory process such as IBD and possibly Crohn's. Patient had been told when she was 41 years old that she might have Crohn's disease and it had a  colonoscopy when she was 64 or 24.  A panel is currently pending and she is seeing Dr. Servando Snare for follow-up as well as a possible colonoscopy at some point. Constipation seems to be improved with removal of the right colon. The repeated bloody stools on Xarelto is a problem but she is seeing her internist this afternoon for that problem. Possibly she could be changed to a different drug or even removed.  She'll follow-up with Dr. Servando Snare and with me as needed in the next month. I'm very interested to hear Dr.Wohl's  determination of whether or not this is truly Crohn's disease  Lattie Haw, MD, FACS

## 2016-04-05 NOTE — Progress Notes (Signed)
Hematology/Oncology Consult note [facility]  Telephone:([phone] [phone]  Patient Care Team: Lyndon Code, MD as PCP - General (Internal Medicine) Lyndon Code, MD (Internal Medicine)   Name of the patient: Chelsea Carroll  [id number]  10/23/1975   Date of visit: 04/05/16  Diagnosis- acute porta venous thrombosis  Chief complaint/ Reason for visit- discuss issues with xarelto  Heme/Onc history: Patient is a 41 year old female with a possible history of Crohn's disease in the past. She has seen Dr. Mechele Collin [facility] and has not benign treatment recently. She presented to the hospital on 03/17/2016 with acute abdominal pain. CT abdomen and pelvis done without contrast showed acute small bowel obstruction with transition point in the pelvic small bowel where there appears to be enteroenteric intussusception. Mildly inflamed appearing distal small bowel. Patient was taken to the OR and underwent extremity laparotomy and right hemicolectomy. Pathology showed acute enteritis but features that were not entirely compatible with IBD. Post op day 2 patient had repeat abdominal pain and repeat CT abdomen showed portal venous thrombosis. Patient was started on lovenox  On 03/30/2016 patient was switched to Xarelto with the plan to continue anticoagulation for 3-6 months  Interval history- patient states that ever since she has started taking Xarelto she feels anxious at times short of breath. Also reports some rectal bleeding on and off. Feels that her abdominal pain is worse when she takes Xarelto ECOG PS- 1 Pain scale- 3   Review of systems- Review of Systems  Constitutional: Positive for malaise/fatigue. Negative for chills, fever and weight loss.  HENT: Negative for congestion, ear discharge and nosebleeds.   Eyes: Negative for blurred vision.  Respiratory: Positive for shortness of breath. Negative for cough, hemoptysis, sputum  production and wheezing.   Cardiovascular: Negative for chest pain, palpitations, orthopnea and claudication.  Gastrointestinal: Positive for abdominal pain. Negative for blood in stool, constipation, diarrhea, heartburn, melena, nausea and vomiting.  Genitourinary: Negative for dysuria, flank pain, frequency, hematuria and urgency.  Musculoskeletal: Negative for back pain, joint pain and myalgias.  Skin: Negative for rash.  Neurological: Negative for dizziness, tingling, focal weakness, seizures, weakness and headaches.  Endo/Heme/Allergies: Does not bruise/bleed easily.  Psychiatric/Behavioral: Negative for depression and suicidal ideas. The patient does not have insomnia.      Current treatment- xarelto  Allergies  Allergen Reactions  . Iodine Hives  . Codeine Other (See Comments)    Other reaction(s): Unknown     Past Medical History:  Diagnosis Date  . Anemia   . Chest pain 06/19/2013  . Epigastric pain   . H/O blood clots   . Heart palpitations 06/19/2013  . HTN (hypertension) 06/19/2013  . Hyperlipidemia 06/19/2013  . Hypertension   . Intussusception intestine (HCC)   . Menstrual migraine 07/27/2015  . Migraines   . SBO (small bowel obstruction) 03/17/2016  . Thrombosis, portal vein      Past Surgical History:  Procedure Laterality Date  . LAPAROTOMY N/A 03/17/2016   Procedure: EXPLORATORY LAPAROTOMY,right colon resection.terminal resection,appendectomy;  Surgeon: Lattie Haw, MD;  Location: [facility] ORS;  Service: General;  Laterality: N/A;    Social History   Social History  . Marital status: Married    Spouse name: N/A  . Number of children: N/A  . Years of education: N/A   Occupational History  . Not on file.   Social History Main Topics  . Smoking status: Never Smoker  . Smokeless tobacco:  Never Used  . Alcohol use Yes     Comment: occasional  . Drug use: No  . Sexual activity: Not on file   Other Topics Concern  . Not on file   Social History  Narrative  . No narrative on file    Family History  Problem Relation Age of Onset  . Hyperlipidemia Mother   . Hypertension Mother      Current Outpatient Prescriptions:  .  nortriptyline (PAMELOR) 10 MG capsule, Take 1 capsule by mouth daily., Disp: , Rfl:  .  carvedilol (COREG) 6.25 MG tablet, Take 1 tablet (6.25 mg total) by mouth 2 (two) times daily. (Patient not taking: Reported on 04/05/2016), Disp: 60 tablet, Rfl: 11 .  ibuprofen (ADVIL,MOTRIN) 200 MG tablet, Take 200 mg by mouth every 6 (six) hours as needed., Disp: , Rfl:  .  lisinopril (PRINIVIL,ZESTRIL) 5 MG tablet, Take 1 tablet (5 mg total) by mouth daily. (Patient not taking: Reported on 04/05/2016), Disp: 30 tablet, Rfl: 5 .  Multiple Vitamin (MULTIVITAMIN) tablet, Take 1 tablet by mouth daily., Disp: , Rfl:  .  oxyCODONE-acetaminophen (PERCOCET/ROXICET) 5-325 MG tablet, Take 1 tablet by mouth every 6 (six) hours as needed for moderate pain. (Patient not taking: Reported on 04/05/2016), Disp: 20 tablet, Rfl: 0 .  rivaroxaban (XARELTO) 20 MG TABS tablet, Take 1 tablet (20 mg total) by mouth daily with supper. Pt given starter pack and needs to be on long term treatment, if it requires PA please send fax [phone], if it has high copay then please let me know, I have a copay card to help.  Pt was given instructions to stop lovenox when she starts xarelto (Patient not taking: Reported on 04/05/2016), Disp: 30 tablet, Rfl: 3 .  traMADol (ULTRAM) 50 MG tablet, Take 50 mg by mouth every 6 (six) hours as needed., Disp: , Rfl:   Physical exam:  Vitals:   04/05/16 1139  BP: 127/86  Pulse: 61  Temp: 98.5 F (36.9 C)  TempSrc: Tympanic  Weight: 118 lb 6.2 oz (53.7 kg)   Physical Exam  Constitutional: She is oriented to person, place, and time and well-developed, well-nourished, and in no distress.  HENT:  Head: Normocephalic and atraumatic.  Eyes: EOM are normal. Pupils are equal, round, and reactive to light.  Neck: Normal  range of motion.  Cardiovascular: Normal rate, regular rhythm and normal heart sounds.   Pulmonary/Chest: Effort normal and breath sounds normal.  Abdominal: Soft. Bowel sounds are normal.  Prior surgical scar healing well  Neurological: She is alert and oriented to person, place, and time.  Skin: Skin is warm and dry.     CMP Latest Ref Rng & Units 03/26/2016  Glucose 65 - 99 mg/dL 098(J)  BUN 6 - 20 mg/dL <1(B)  Creatinine 1.47 - 1.00 mg/dL 8.29  Sodium 562 - 130 mmol/L 136  Potassium 3.5 - 5.1 mmol/L 4.0  Chloride 101 - 111 mmol/L 102  CO2 22 - 32 mmol/L 28  Calcium 8.9 - 10.3 mg/dL 8.2(L)  Total Protein 6.5 - 8.1 g/dL -  Total Bilirubin 0.3 - 1.2 mg/dL -  Alkaline Phos 38 - 865 U/L -  AST 15 - 41 U/L -  ALT 14 - 54 U/L -   CBC Latest Ref Rng & Units 04/04/2016  WBC 3.6 - 11.0 K/uL 6.5  Hemoglobin 12.0 - 16.0 g/dL 7.8(I)  Hematocrit 69.6 - 47.0 % 28.1(L)  Platelets 150 - 440 K/uL 394    No images are  attached to the encounter.  Ct Abdomen Pelvis Wo Contrast  Result Date: 03/17/2016 CLINICAL DATA:  41 year old female with intermittent epigastric, abdominal and pelvic pain since yesterday. Initial encounter. EXAM: CT ABDOMEN AND PELVIS WITHOUT CONTRAST TECHNIQUE: Multidetector CT imaging of the abdomen and pelvis was performed following the standard protocol without IV contrast. COMPARISON:  Noncontrast CT Abdomen and Pelvis 10/16/2007. Right upper quadrant ultrasound 04/27/2015. FINDINGS: Lower chest: Minor atelectasis or scarring in the medial segment of the right middle lobe, otherwise negative. No pericardial or pleural effusion. Hepatobiliary: Negative noncontrast liver. The gallbladder is mildly distended but otherwise negative. Pancreas: Negative. Spleen: Negative. No abdominal free air or free fluid. Adrenals/Urinary Tract: Negative adrenal glands. Negative noncontrast kidneys. No hydroureter. Stomach/Bowel: Negative rectosigmoid colon aside from some retained stool. Moderate  volume of retained stool throughout the left colon, transverse and right colon. Normal appendix (coronal image 30). The terminal ileum is decompressed (series 2, image 58), but just upstream beginning in the pelvis there are dilated fluid-filled small bowel loops throughout the remaining abdomen. There a fairly discrete transition point on series 5, image 38, in there appears to be a small bowel to small bowel intussusception at this transition. No discrete small bowel mass. No discrete Meckel diverticulum. The leading edge of oral contrast has almost reached the dilated pelvic loops. Small bowel loops measure up to 30 mm diameter, and the most distal dilated loops appear indistinct with mild mesenteric stranding (coronal image 19). The proximal jejunum is normal. The duodenum and stomach are normal. Vascular/Lymphatic: No intravenous contrast. Vascular patency is not evaluated in the absence of IV contrast. No lymphadenopathy. Reproductive: Negative. Other: Small volume pelvic free fluid, mostly in the cul-de-sac. Musculoskeletal: No acute osseous abnormality identified. IMPRESSION: 1. Acute Small Bowel Obstruction with transition point in the pelvic small bowel where there appears to be an Enteroenteric Intussusception. Adult intussusception is usually due to a pathologically lead point. In this area of distal small bowel consider a Meckel's diverticulum or occult small bowel mass. 2. Mildly inflamed appearing distal loops of obstruct the small bowel. Trace pelvic free fluid. No abdominal free fluid. No free air. Electronically Signed   By: Odessa Fleming M.D.   On: 03/17/2016 13:17   Dg Abd 1 View  Result Date: 03/21/2016 CLINICAL DATA:  Abdominal distention, 3 days postop from exploratory laparotomy with right hemicolectomy due to small-bowel obstruction EXAM: ABDOMEN - 1 VIEW COMPARISON:  CT abdomen pelvis of 03/17/2016 FINDINGS: There is a small amount of contrast throughout the nondistended colon from recent CT  of the abdomen. There is persistent slight dilatation of several loops of small bowel which could indicate persistent small-bowel obstruction versus ileus postoperatively. No opaque calculi is seen. NG tube tip is in the fundus of the stomach. IMPRESSION: 1. Still slight gaseous distention of small bowel loops which may indicate ileus or persistent partial small bowel obstruction. 2. NG tube tip in the fundus of the stomach. Electronically Signed   By: Dwyane Dee M.D.   On: 03/21/2016 08:54   Ct Abdomen Pelvis W Contrast  Result Date: 03/23/2016 CLINICAL DATA:  Severe crampy abdominal pain. Recent laparotomy 03/17/2016. History of intestinal problems since age 16. Possible Crohn's disease. Right hemicolectomy. EXAM: CT ABDOMEN AND PELVIS WITH CONTRAST TECHNIQUE: Multidetector CT imaging of the abdomen and pelvis was performed using the standard protocol following bolus administration of intravenous contrast. CONTRAST:  ISOVUE-300 IOPAMIDOL (ISOVUE-300) INJECTION 61% COMPARISON:  04/06/2016 FINDINGS: Lower chest: The lung bases are clear.  No esophageal distention. Hepatobiliary: Diffuse low attenuation change throughout the liver suggesting fatty infiltration. Filling defect in the distal main portal vein extending into the right branches suggesting portal venous thrombosis. No focal liver lesions are identified. Gallbladder is distended without stone or wall thickening. No bile duct dilatation. Pancreas: Unremarkable. No pancreatic ductal dilatation or surrounding inflammatory changes. Spleen: Normal in size without focal abnormality. Adrenals/Urinary Tract: No adrenal gland nodules. Kidneys are symmetrical. No hydronephrosis or hydroureter. Bladder contains gas. This could result from catheterization, cystitis, or fistula. Correlation with urinalysis is recommended. No bladder wall thickening. Stomach/Bowel: Enteric tube with tip in the stomach. Stomach appears normal. Small bowel are dilated and  fluid-filled. Contrast material is demonstrated in the colon, likely from the previous CT scan. There has been interval postoperative change since the previous study. Changes may represent postoperative ileus although residual or recurrent obstruction is not excluded. Small amount of mesenteric edema. Prominent lymph nodes in the mesenteric without pathologic enlargement likely represent reactive nodes. Colon is not abnormally distended. No fluid collections to suggest abscess. No contrast extravasation. Vascular/Lymphatic: No significant vascular findings are present. No enlarged abdominal or pelvic lymph nodes. Reproductive: Uterus and ovaries are not enlarged. Small amount of free fluid in the pelvis is likely physiologic. Other: A few tiny gas bubbles are demonstrated in the upper abdomen, likely postoperative. Postoperative changes in the anterior abdominal wall with skin clips and linear infiltration along the midline. Small amount of edema in the anterior subcutaneous fat. Musculoskeletal: No acute or significant osseous findings. IMPRESSION: Recent postoperative changes including partial colonic resection. Persistent small bowel dilatation with fluid-filled loops may represent postoperative ileus although residual or recurrent obstruction not excluded. 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 Gas in the bladder may be due to instrumentation or other bladder path biology. These results were called by telephone at the time of interpretation on 03/23/2016 at 6:00 am to Dr. Lucretia Roers, who verbally acknowledged these results. Electronically Signed   By: Burman Nieves M.D.   On: 03/23/2016 06:14   Dg Chest Portable 1 View  Result Date: 03/17/2016 CLINICAL DATA:  NG tube placement, having difficulty advancing. Pt being admitted for intussusception; acute onset of right upper quadrant abdominal pain with nausea and vomiting since yesterday. Hx- HTN EXAM: PORTABLE CHEST 1 VIEW  COMPARISON:  03/02/2016 FINDINGS: Nasogastric tube has been placed, tip in the right mainstem bronchus. Cardiomediastinal silhouette is normal. No pneumothorax. IMPRESSION: Nasogastric tube in the right mainstem bronchus. Critical Value/emergent results were called by telephone at the time of interpretation on 03/17/2016 at 3:09 pm to Dr. Dionne Milo , who verbally acknowledged these results. The nasogastric tube has been removed and replaced. Electronically Signed   By: Norva Pavlov M.D.   On: 03/17/2016 15:11     Assessment and plan- Patient is a 41 y.o. 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 Also found to have acute portal venous thrombosis  1. Her CBC that was checked yesterday is overall stable as compared to 11 days ago.H&H was 9.3/28.1 on 04/04/2016 and 19.7/28.911 days ago. Hence I do not think that her rectal bleeding is hemodynamically significant to stop anticoagulation. I also discussed the risks of stopping anticoagulation at this time including extension of her portal venous clot that could potentially be due to mesenteric infarction and hence I would recommend continuing anticoagulation for at least 3 months. Patient has a lot of anxiety surrounding  Xarelto and side effects such asabdominal pain and shortness of breath are not typically seen with Xarelto. However given patient anxiety I will switch her back to Lovenox which she has tolerated well in the past. She does have about a week's worth of Lovenox and we will have to work on her insurance authorization for further doses. She also needs to follow-up with Dr. Servando Snare from GI with regards to her GI symptoms. It is possible that the rectal bleeding is related to her inflammation from bowel rather than Xarelto. I will however check her CBC in about 10 days' time to assess stability and see her back in 6 weeks' time with a repeat CBC and BMP. I also  personally spoken to Dr. Servando Snare to arrange GI follow-up     Visit Diagnosis 1. Thrombosis, portal vein      Dr. Owens Shark, MD, MPH [facility] at [facility] Pager- [phone] 04/05/2016 2:01 PM

## 2016-04-05 NOTE — Telephone Encounter (Signed)
Yes she can come for cb. But needs to see GI for her GI issues

## 2016-04-05 NOTE — Patient Instructions (Signed)
Please call our office if you have questions or concerns.   

## 2016-04-06 ENCOUNTER — Telehealth: Payer: Self-pay

## 2016-04-06 ENCOUNTER — Telehealth: Payer: Self-pay | Admitting: *Deleted

## 2016-04-06 NOTE — Telephone Encounter (Signed)
-----   Message from Midge Minium, MD sent at 03/27/2016  9:49 AM EST ----- Please have the patient come in for a follow up.

## 2016-04-06 NOTE — Telephone Encounter (Signed)
Pt scheduled for a follow up appt with Dr. Servando Snare on 04/10/16.

## 2016-04-06 NOTE — Telephone Encounter (Signed)
Pt called to clarify dosing directions for lovenox. After discussing with Cordelia Pen, RN, pt was advised to give lovenox BID. Instructed pt that will need to administer between 0.5-0.6 on the syringe since pt was 53kg at last visit. Pt was informed that Cordelia Pen is currently working on new prescription for new dose that will be given once a day but pt should continue lovenox BID at this time until Cordelia Pen gives her a call. Pt verbalized understanding.

## 2016-04-09 ENCOUNTER — Telehealth: Payer: Self-pay | Admitting: *Deleted

## 2016-04-09 ENCOUNTER — Encounter: Payer: Self-pay | Admitting: Cardiovascular Disease

## 2016-04-09 NOTE — Telephone Encounter (Signed)
Spoke to pt and told her that the pharmacy has lovenox 80 mg daily and she will use her existing lovenox 55 mg bid and when she runs out then she will start the 80 once daily.  Her insurance will only approve 28 days at a time and it will be 95.00.  She is ok with and understands directions. 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  She believes that when she had the obstruction the inflammatory process caused the clot so if we get to bottom of the bowel disease then that will dictate how long to stay on blood thinners. She has appt with dr. Smith Mince tom.

## 2016-04-10 ENCOUNTER — Encounter: Payer: Self-pay | Admitting: Gastroenterology

## 2016-04-10 ENCOUNTER — Other Ambulatory Visit: Payer: Self-pay

## 2016-04-10 ENCOUNTER — Ambulatory Visit (INDEPENDENT_AMBULATORY_CARE_PROVIDER_SITE_OTHER): Payer: BLUE CROSS/BLUE SHIELD | Admitting: Gastroenterology

## 2016-04-10 ENCOUNTER — Ambulatory Visit: Payer: BLUE CROSS/BLUE SHIELD | Admitting: Oncology

## 2016-04-10 VITALS — BP 127/85 | HR 91 | Temp 98.7°F | Ht 62.0 in | Wt 120.0 lb

## 2016-04-10 DIAGNOSIS — K5090 Crohn's disease, unspecified, without complications: Secondary | ICD-10-CM

## 2016-04-10 DIAGNOSIS — K5000 Crohn's disease of small intestine without complications: Secondary | ICD-10-CM

## 2016-04-11 ENCOUNTER — Other Ambulatory Visit: Payer: Self-pay | Admitting: *Deleted

## 2016-04-11 DIAGNOSIS — I81 Portal vein thrombosis: Secondary | ICD-10-CM

## 2016-04-11 NOTE — Progress Notes (Signed)
Primary Care Physician: Lyndon Code, MD  Primary Gastroenterologist:  Dr. Midge Minium  Chief Complaint  Patient presents with  . Hospitalization Follow-up    HPI: Chelsea Carroll is a 41 y.o. female here for follow-up after being in the hospital. The patient had been admitted to the hospital with a obstructed bowel. The patient had a resection of her terminal ileum and her right colon. The patient's biopsies showed inflammation but were not wholly consistent with Crohn's disease. The patient had been told since she is approximate 41 years old that she had Crohn's disease although she has not followed up with a gastrologist for many years. The patient was seen in the hospital at that time and has been doing better since she was discharged. The patient denies any abdominal pain but states that she has not up to her full diet as of yet. The patient had an IBD panel sent off which was not positive for inflammatory bowel disease. The patient also reports that her bowel movements are becoming more solid since having surgery. She also states that her abdominal pain is minimal.  Current Outpatient Prescriptions  Medication Sig Dispense Refill  . oxyCODONE-acetaminophen (PERCOCET/ROXICET) 5-325 MG tablet Take 1 tablet by mouth every 6 (six) hours as needed for moderate pain. 20 tablet 0  . carvedilol (COREG) 6.25 MG tablet Take 1 tablet (6.25 mg total) by mouth 2 (two) times daily. (Patient not taking: Reported on 04/05/2016) 60 tablet 11  . ibuprofen (ADVIL,MOTRIN) 200 MG tablet Take 200 mg by mouth every 6 (six) hours as needed.    Marland Kitchen lisinopril (PRINIVIL,ZESTRIL) 5 MG tablet Take 1 tablet (5 mg total) by mouth daily. (Patient not taking: Reported on 04/05/2016) 30 tablet 5  . Multiple Vitamin (MULTIVITAMIN) tablet Take 1 tablet by mouth daily.    . nortriptyline (PAMELOR) 10 MG capsule Take 1 capsule by mouth daily.    . rivaroxaban (XARELTO) 20 MG TABS tablet Take 1 tablet (20 mg total) by mouth  daily with supper. Pt given starter pack and needs to be on long term treatment, if it requires PA please send fax [phone], if it has high copay then please let me know, I have a copay card to help.  Pt was given instructions to stop lovenox when she starts xarelto (Patient not taking: Reported on 04/05/2016) 30 tablet 3  . traMADol (ULTRAM) 50 MG tablet Take 50 mg by mouth every 6 (six) hours as needed.     No current facility-administered medications for this visit.     Allergies as of 04/10/2016 - Review Complete 04/10/2016  Allergen Reaction Noted  . Iodine Hives 06/19/2013  . Codeine Other (See Comments) 01/15/2014    ROS:  General: Negative for anorexia, weight loss, fever, chills, fatigue, weakness. ENT: Negative for hoarseness, difficulty swallowing , nasal congestion. CV: Negative for chest pain, angina, palpitations, dyspnea on exertion, peripheral edema.  Respiratory: Negative for dyspnea at rest, dyspnea on exertion, cough, sputum, wheezing.  GI: See history of present illness. GU:  Negative for dysuria, hematuria, urinary incontinence, urinary frequency, nocturnal urination.  Endo: Negative for unusual weight change.    Physical Examination:   BP 127/85   Pulse 91   Temp 98.7 F (37.1 C) (Oral)   Ht 5\' 2"  (1.575 m)   Wt 120 lb (54.4 kg)   BMI 21.95 kg/m   General: Well-nourished, well-developed in no acute distress.  Eyes: No icterus. Conjunctivae pink. Mouth: Oropharyngeal mucosa moist and pink , no  lesions erythema or exudate. Lungs: Clear to auscultation bilaterally. Non-labored. Heart: Regular rate and rhythm, no murmurs rubs or gallops.  Abdomen: Bowel sounds are normal, tender in the right lower quadrant, nondistended, no hepatosplenomegaly or masses, no abdominal bruits or hernia , no rebound or guarding.   Extremities: No lower extremity edema. No clubbing or deformities. Neuro: Alert and oriented x 3.  Grossly intact. Skin: Warm and dry, no jaundice.    Psych: Alert and cooperative, normal mood and affect.  Labs:    Imaging Studies: Ct Abdomen Pelvis Wo Contrast  Result Date: 03/17/2016 CLINICAL DATA:  41 year old female with intermittent epigastric, abdominal and pelvic pain since yesterday. Initial encounter. EXAM: CT ABDOMEN AND PELVIS WITHOUT CONTRAST TECHNIQUE: Multidetector CT imaging of the abdomen and pelvis was performed following the standard protocol without IV contrast. COMPARISON:  Noncontrast CT Abdomen and Pelvis 10/16/2007. Right upper quadrant ultrasound 04/27/2015. FINDINGS: Lower chest: Minor atelectasis or scarring in the medial segment of the right middle lobe, otherwise negative. No pericardial or pleural effusion. Hepatobiliary: Negative noncontrast liver. The gallbladder is mildly distended but otherwise negative. Pancreas: Negative. Spleen: Negative. No abdominal free air or free fluid. Adrenals/Urinary Tract: Negative adrenal glands. Negative noncontrast kidneys. No hydroureter. Stomach/Bowel: Negative rectosigmoid colon aside from some retained stool. Moderate volume of retained stool throughout the left colon, transverse and right colon. Normal appendix (coronal image 30). The terminal ileum is decompressed (series 2, image 58), but just upstream beginning in the pelvis there are dilated fluid-filled small bowel loops throughout the remaining abdomen. There a fairly discrete transition point on series 5, image 38, in there appears to be a small bowel to small bowel intussusception at this transition. No discrete small bowel mass. No discrete Meckel diverticulum. The leading edge of oral contrast has almost reached the dilated pelvic loops. Small bowel loops measure up to 30 mm diameter, and the most distal dilated loops appear indistinct with mild mesenteric stranding (coronal image 19). The proximal jejunum is normal. The duodenum and stomach are normal. Vascular/Lymphatic: No intravenous contrast. Vascular patency is not  evaluated in the absence of IV contrast. No lymphadenopathy. Reproductive: Negative. Other: Small volume pelvic free fluid, mostly in the cul-de-sac. Musculoskeletal: No acute osseous abnormality identified. IMPRESSION: 1. Acute Small Bowel Obstruction with transition point in the pelvic small bowel where there appears to be an Enteroenteric Intussusception. Adult intussusception is usually due to a pathologically lead point. In this area of distal small bowel consider a Meckel's diverticulum or occult small bowel mass. 2. Mildly inflamed appearing distal loops of obstruct the small bowel. Trace pelvic free fluid. No abdominal free fluid. No free air. Electronically Signed   By: Odessa Fleming M.D.   On: 03/17/2016 13:17   Dg Abd 1 View  Result Date: 03/21/2016 CLINICAL DATA:  Abdominal distention, 3 days postop from exploratory laparotomy with right hemicolectomy due to small-bowel obstruction EXAM: ABDOMEN - 1 VIEW COMPARISON:  CT abdomen pelvis of 03/17/2016 FINDINGS: There is a small amount of contrast throughout the nondistended colon from recent CT of the abdomen. There is persistent slight dilatation of several loops of small bowel which could indicate persistent small-bowel obstruction versus ileus postoperatively. No opaque calculi is seen. NG tube tip is in the fundus of the stomach. IMPRESSION: 1. Still slight gaseous distention of small bowel loops which may indicate ileus or persistent partial small bowel obstruction. 2. NG tube tip in the fundus of the stomach. Electronically Signed   By: Lucienne Minks.D.  On: 03/21/2016 08:54   Ct Abdomen Pelvis W Contrast  Result Date: 03/23/2016 CLINICAL DATA:  Severe crampy abdominal pain. Recent laparotomy 03/17/2016. History of intestinal problems since age 5. Possible Crohn's disease. Right hemicolectomy. EXAM: CT ABDOMEN AND PELVIS WITH CONTRAST TECHNIQUE: Multidetector CT imaging of the abdomen and pelvis was performed using the standard protocol following  bolus administration of intravenous contrast. CONTRAST:  ISOVUE-300 IOPAMIDOL (ISOVUE-300) INJECTION 61% COMPARISON:  04/06/2016 FINDINGS: Lower chest: The lung bases are clear.  No esophageal distention. Hepatobiliary: Diffuse low attenuation change throughout the liver suggesting fatty infiltration. Filling defect in the distal main portal vein extending into the right branches suggesting portal venous thrombosis. No focal liver lesions are identified. Gallbladder is distended without stone or wall thickening. No bile duct dilatation. Pancreas: Unremarkable. No pancreatic ductal dilatation or surrounding inflammatory changes. Spleen: Normal in size without focal abnormality. Adrenals/Urinary Tract: No adrenal gland nodules. Kidneys are symmetrical. No hydronephrosis or hydroureter. Bladder contains gas. This could result from catheterization, cystitis, or fistula. Correlation with urinalysis is recommended. No bladder wall thickening. Stomach/Bowel: Enteric tube with tip in the stomach. Stomach appears normal. Small bowel are dilated and fluid-filled. Contrast material is demonstrated in the colon, likely from the previous CT scan. There has been interval postoperative change since the previous study. Changes may represent postoperative ileus although residual or recurrent obstruction is not excluded. Small amount of mesenteric edema. Prominent lymph nodes in the mesenteric without pathologic enlargement likely represent reactive nodes. Colon is not abnormally distended. No fluid collections to suggest abscess. No contrast extravasation. Vascular/Lymphatic: No significant vascular findings are present. No enlarged abdominal or pelvic lymph nodes. Reproductive: Uterus and ovaries are not enlarged. Small amount of free fluid in the pelvis is likely physiologic. Other: A few tiny gas bubbles are demonstrated in the upper abdomen, likely postoperative. Postoperative changes in the anterior abdominal wall with  skin clips and linear infiltration along the midline. Small amount of edema in the anterior subcutaneous fat. Musculoskeletal: No acute or significant osseous findings. IMPRESSION: Recent postoperative changes including partial colonic resection. Persistent small bowel dilatation with fluid-filled loops may represent postoperative ileus although residual or recurrent obstruction not excluded. 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 Gas in the bladder may be due to instrumentation or other bladder path biology. These results were called by telephone at the time of interpretation on 03/23/2016 at 6:00 am to Dr. Lucretia Roers, who verbally acknowledged these results. Electronically Signed   By: Burman Nieves M.D.   On: 03/23/2016 06:14   Dg Chest Portable 1 View  Result Date: 03/17/2016 CLINICAL DATA:  NG tube placement, having difficulty advancing. Pt being admitted for intussusception; acute onset of right upper quadrant abdominal pain with nausea and vomiting since yesterday. Hx- HTN EXAM: PORTABLE CHEST 1 VIEW COMPARISON:  03/02/2016 FINDINGS: Nasogastric tube has been placed, tip in the right mainstem bronchus. Cardiomediastinal silhouette is normal. No pneumothorax. IMPRESSION: Nasogastric tube in the right mainstem bronchus. Critical Value/emergent results were called by telephone at the time of interpretation on 03/17/2016 at 3:09 pm to Dr. Dionne Milo , who verbally acknowledged these results. The nasogastric tube has been removed and replaced. Electronically Signed   By: Norva Pavlov M.D.   On: 03/17/2016 15:11    Assessment and Plan:   Chelsea Carroll is a 41 y.o. y/o female who recently had a resection of her right colon and terminal ileum for obstruction. The biopsies were not consistent with Crohn's disease  but did show inflammation. The patient also had an IBD panel sent off that was not conclusive for IBD. The patient has been told that we will hold off on any  medications at this time and set her up for colonoscopy in 8 weeks when she has had time to heal and then we can look at the rest of the colon and small bowel for any signs of recurrent inflammatory bowel disease. The patient has been explained the plan and agrees with it.    Midge Minium, MD. Clementeen Graham   Note: This dictation was prepared with Dragon dictation along with smaller phrase technology. Any transcriptional errors that result from this process are unintentional.

## 2016-04-17 ENCOUNTER — Encounter: Payer: Self-pay | Admitting: Oncology

## 2016-04-17 ENCOUNTER — Other Ambulatory Visit: Payer: Self-pay

## 2016-04-17 ENCOUNTER — Inpatient Hospital Stay: Payer: BLUE CROSS/BLUE SHIELD | Attending: Oncology | Admitting: Oncology

## 2016-04-17 ENCOUNTER — Inpatient Hospital Stay: Payer: BLUE CROSS/BLUE SHIELD

## 2016-04-17 ENCOUNTER — Telehealth: Payer: Self-pay | Admitting: *Deleted

## 2016-04-17 ENCOUNTER — Encounter: Payer: Self-pay | Admitting: Gastroenterology

## 2016-04-17 VITALS — BP 140/98 | HR 96 | Temp 98.8°F | Resp 18 | Ht 62.0 in | Wt 121.3 lb

## 2016-04-17 DIAGNOSIS — K561 Intussusception: Secondary | ICD-10-CM | POA: Insufficient documentation

## 2016-04-17 DIAGNOSIS — R590 Localized enlarged lymph nodes: Secondary | ICD-10-CM | POA: Insufficient documentation

## 2016-04-17 DIAGNOSIS — R002 Palpitations: Secondary | ICD-10-CM | POA: Insufficient documentation

## 2016-04-17 DIAGNOSIS — K529 Noninfective gastroenteritis and colitis, unspecified: Secondary | ICD-10-CM | POA: Diagnosis not present

## 2016-04-17 DIAGNOSIS — Z8669 Personal history of other diseases of the nervous system and sense organs: Secondary | ICD-10-CM | POA: Diagnosis not present

## 2016-04-17 DIAGNOSIS — K5090 Crohn's disease, unspecified, without complications: Secondary | ICD-10-CM

## 2016-04-17 DIAGNOSIS — I81 Portal vein thrombosis: Secondary | ICD-10-CM | POA: Insufficient documentation

## 2016-04-17 DIAGNOSIS — D509 Iron deficiency anemia, unspecified: Secondary | ICD-10-CM | POA: Diagnosis not present

## 2016-04-17 DIAGNOSIS — Z79899 Other long term (current) drug therapy: Secondary | ICD-10-CM | POA: Diagnosis not present

## 2016-04-17 DIAGNOSIS — I10 Essential (primary) hypertension: Secondary | ICD-10-CM | POA: Insufficient documentation

## 2016-04-17 DIAGNOSIS — Z7901 Long term (current) use of anticoagulants: Secondary | ICD-10-CM | POA: Insufficient documentation

## 2016-04-17 DIAGNOSIS — E785 Hyperlipidemia, unspecified: Secondary | ICD-10-CM | POA: Diagnosis not present

## 2016-04-17 DIAGNOSIS — D649 Anemia, unspecified: Secondary | ICD-10-CM | POA: Diagnosis not present

## 2016-04-17 LAB — IRON AND TIBC
Iron: 7 ug/dL — ABNORMAL LOW (ref 28–170)
Saturation Ratios: 2 % — ABNORMAL LOW (ref 10.4–31.8)
TIBC: 439 ug/dL (ref 250–450)
UIBC: 432 ug/dL

## 2016-04-17 LAB — SEDIMENTATION RATE: Sed Rate: 35 mm/hr — ABNORMAL HIGH (ref 0–20)

## 2016-04-17 LAB — CBC
HCT: 24.1 % — ABNORMAL LOW (ref 35.0–47.0)
Hemoglobin: 7.9 g/dL — ABNORMAL LOW (ref 12.0–16.0)
MCH: 26.3 pg (ref 26.0–34.0)
MCHC: 32.7 g/dL (ref 32.0–36.0)
MCV: 80.3 fL (ref 80.0–100.0)
Platelets: 321 10*3/uL (ref 150–440)
RBC: 3 MIL/uL — ABNORMAL LOW (ref 3.80–5.20)
RDW: 16.6 % — ABNORMAL HIGH (ref 11.5–14.5)
WBC: 5.3 10*3/uL (ref 3.6–11.0)

## 2016-04-17 LAB — VITAMIN B12: Vitamin B-12: 276 pg/mL (ref 180–914)

## 2016-04-17 LAB — RETICULOCYTES
RBC.: 3.15 MIL/uL — ABNORMAL LOW (ref 3.80–5.20)
Retic Count, Absolute: 56.7 10*3/uL (ref 19.0–183.0)
Retic Ct Pct: 1.8 % (ref 0.4–3.1)

## 2016-04-17 LAB — FERRITIN: Ferritin: 5 ng/mL — ABNORMAL LOW (ref 11–307)

## 2016-04-17 LAB — SAMPLE TO BLOOD BANK

## 2016-04-17 LAB — TSH: TSH: 0.646 u[IU]/mL (ref 0.350–4.500)

## 2016-04-17 LAB — FOLATE: Folate: 16.1 ng/mL (ref >5.9)

## 2016-04-17 NOTE — Telephone Encounter (Signed)
Called in prednisone 50 mg tablets 1 tablet 8 pm tonight, 1 tablet 2 am 3/7 and last tablet at 8 am 3/7.  The pt. Has it written down to purchase benadryl and zantac otc and has to take 1 of each at 8 am tom. Morning. She verbalizes understanding of the direction and she has the instructions on bottle of prednisone and she will pick it up this evening.

## 2016-04-17 NOTE — Progress Notes (Signed)
Hematology/Oncology Consult note [facility]  [phone] [phone]  Patient Care Team: Lyndon Code, MD as PCP - General (Internal Medicine) Lyndon Code, MD (Internal Medicine)   Name of the patient: Chelsea Carroll  [id number]  1976-01-16   Date of visit: 04/17/16  Diagnosis- acute portal venous thrombosis  Chief complaint/ Reason for visit- routine f/u  Heme/Onc history: Patient is a 41 year old female with a possible history of Crohn's disease in the past. She has seen Dr. Mechele Collin [facility] and has not benign treatment recently. She presented to the hospital on 03/17/2016 with acute abdominal pain. CT abdomen and pelvis done without contrast showed acute small bowel obstruction with transition point in the pelvic small bowel where there appears to be enteroenteric intussusception. Mildly inflamed appearing distal small bowel. Patient was taken to the OR and underwent extremity laparotomy and right hemicolectomy. Pathology showed acute enteritis but features that were not entirely compatible with IBD. Post op day 2 patient had repeat abdominal pain and repeat CT abdomen showed portal venous thrombosis. Patient was started on lovenox  On 03/30/2016 patient was switched to Xarelto with the plan to continue anticoagulation for 3-6 months  On 04/05/16 patient was switched back to lovenox as she was having side effects including abdominal pain and intermittent bleeding  Seen by Dr. Servando Snare from GI on 04/10/16 and plan is for repeat colonoscopy in April 2018  Interval history- Patient reports intermittent self limited palpitations when she is doing her daily chores. Denies any bleeding in stools, urine. Denies any gum bleeds or nose bleeds. Denies bloody diarrhea. She is having problems with consitpation    Review of systems- Review of Systems  Constitutional: Negative for chills, fever, malaise/fatigue and weight loss.    HENT: Negative for congestion, ear discharge and nosebleeds.   Eyes: Negative for blurred vision.  Respiratory: Negative for cough, hemoptysis, sputum production, shortness of breath and wheezing.   Cardiovascular: Negative for chest pain, palpitations, orthopnea and claudication.  Gastrointestinal: Positive for constipation. Negative for abdominal pain, blood in stool, diarrhea, heartburn, melena, nausea and vomiting.  Genitourinary: Negative for dysuria, flank pain, frequency, hematuria and urgency.  Musculoskeletal: Negative for back pain, joint pain and myalgias.  Skin: Negative for rash.  Neurological: Negative for dizziness, tingling, focal weakness, seizures, weakness and headaches.  Endo/Heme/Allergies: Does not bruise/bleed easily.  Psychiatric/Behavioral: Negative for depression and suicidal ideas. The patient does not have insomnia.      Current treatment- lovenox  Allergies  Allergen Reactions  . Iodine Hives  . Codeine Other (See Comments)    Other reaction(s): Unknown     Past Medical History:  Diagnosis Date  . Anemia   . Chest pain 06/19/2013  . Epigastric pain   . H/O blood clots   . Heart palpitations 06/19/2013  . HTN (hypertension) 06/19/2013  . Hyperlipidemia 06/19/2013  . Hypertension   . Intussusception intestine (HCC)   . Menstrual migraine 07/27/2015  . Migraines   . SBO (small bowel obstruction) 03/17/2016  . Thrombosis, portal vein      Past Surgical History:  Procedure Laterality Date  . LAPAROTOMY N/A 03/17/2016   Procedure: EXPLORATORY LAPAROTOMY,right colon resection.terminal resection,appendectomy;  Surgeon: Lattie Haw, MD;  Location: [facility] ORS;  Service: General;  Laterality: N/A;    Social History   Social History  . Marital status: Married    Spouse name: N/A  . Number of children: N/A  . Years of  education: N/A   Occupational History  . Not on file.   Social History Main Topics  . Smoking status: Never Smoker  . Smokeless  tobacco: Never Used  . Alcohol use Yes     Comment: occasional  . Drug use: No  . Sexual activity: Not on file   Other Topics Concern  . Not on file   Social History Narrative  . No narrative on file    Family History  Problem Relation Age of Onset  . Hyperlipidemia Mother   . Hypertension Mother      Current Outpatient Prescriptions:  .  carvedilol (COREG) 6.25 MG tablet, Take 1 tablet (6.25 mg total) by mouth 2 (two) times daily. (Patient not taking: Reported on 04/05/2016), Disp: 60 tablet, Rfl: 11 .  ibuprofen (ADVIL,MOTRIN) 200 MG tablet, Take 200 mg by mouth every 6 (six) hours as needed., Disp: , Rfl:  .  lisinopril (PRINIVIL,ZESTRIL) 5 MG tablet, Take 1 tablet (5 mg total) by mouth daily. (Patient not taking: Reported on 04/05/2016), Disp: 30 tablet, Rfl: 5 .  Multiple Vitamin (MULTIVITAMIN) tablet, Take 1 tablet by mouth daily., Disp: , Rfl:  .  nortriptyline (PAMELOR) 10 MG capsule, Take 1 capsule by mouth daily., Disp: , Rfl:  .  oxyCODONE-acetaminophen (PERCOCET/ROXICET) 5-325 MG tablet, Take 1 tablet by mouth every 6 (six) hours as needed for moderate pain., Disp: 20 tablet, Rfl: 0 .  rivaroxaban (XARELTO) 20 MG TABS tablet, Take 1 tablet (20 mg total) by mouth daily with supper. Pt given starter pack and needs to be on long term treatment, if it requires PA please send fax [phone], if it has high copay then please let me know, I have a copay card to help.  Pt was given instructions to stop lovenox when she starts xarelto (Patient not taking: Reported on 04/05/2016), Disp: 30 tablet, Rfl: 3 .  traMADol (ULTRAM) 50 MG tablet, Take 50 mg by mouth every 6 (six) hours as needed., Disp: , Rfl:   Physical exam:  Vitals:   04/17/16 1641  BP: (!) 140/98  Pulse: 96  Resp: 18  Temp: 98.8 F (37.1 C)  TempSrc: Tympanic  Weight: 121 lb 4.1 oz (55 kg)  Height: 5\' 2"  (1.575 m)   Physical Exam  Constitutional: She is oriented to person, place, and time and well-developed,  well-nourished, and in no distress.  HENT:  Head: Normocephalic and atraumatic.  Eyes: EOM are normal. Pupils are equal, round, and reactive to light.  Neck: Normal range of motion.  Cardiovascular: Normal rate, regular rhythm and normal heart sounds.   Pulmonary/Chest: Effort normal and breath sounds normal.  Abdominal: Soft. Bowel sounds are normal.  Surgical scar has healed well. No TTP to palpation  Neurological: She is alert and oriented to person, place, and time.  Skin: Skin is warm and dry.     CMP Latest Ref Rng & Units 03/26/2016  Glucose 65 - 99 mg/dL 098(J)  BUN 6 - 20 mg/dL <1(B)  Creatinine 1.47 - 1.00 mg/dL 8.29  Sodium 562 - 130 mmol/L 136  Potassium 3.5 - 5.1 mmol/L 4.0  Chloride 101 - 111 mmol/L 102  CO2 22 - 32 mmol/L 28  Calcium 8.9 - 10.3 mg/dL 8.2(L)  Total Protein 6.5 - 8.1 g/dL -  Total Bilirubin 0.3 - 1.2 mg/dL -  Alkaline Phos 38 - 865 U/L -  AST 15 - 41 U/L -  ALT 14 - 54 U/L -   CBC Latest Ref Rng & Units  04/17/2016  WBC 3.6 - 11.0 K/uL 5.3  Hemoglobin 12.0 - 16.0 g/dL 7.9(L)  Hematocrit 35.0 - 47.0 % 24.1(L)  Platelets 150 - 440 K/uL 321    No images are attached to the encounter.  Dg Abd 1 View  Result Date: 03/21/2016 CLINICAL DATA:  Abdominal distention, 3 days postop from exploratory laparotomy with right hemicolectomy due to small-bowel obstruction EXAM: ABDOMEN - 1 VIEW COMPARISON:  CT abdomen pelvis of 03/17/2016 FINDINGS: There is a small amount of contrast throughout the nondistended colon from recent CT of the abdomen. There is persistent slight dilatation of several loops of small bowel which could indicate persistent small-bowel obstruction versus ileus postoperatively. No opaque calculi is seen. NG tube tip is in the fundus of the stomach. IMPRESSION: 1. Still slight gaseous distention of small bowel loops which may indicate ileus or persistent partial small bowel obstruction. 2. NG tube tip in the fundus of the stomach. Electronically  Signed   By: Dwyane Dee M.D.   On: 03/21/2016 08:54   Ct Abdomen Pelvis W Contrast  Result Date: 03/23/2016 CLINICAL DATA:  Severe crampy abdominal pain. Recent laparotomy 03/17/2016. History of intestinal problems since age 75. Possible Crohn's disease. Right hemicolectomy. EXAM: CT ABDOMEN AND PELVIS WITH CONTRAST TECHNIQUE: Multidetector CT imaging of the abdomen and pelvis was performed using the standard protocol following bolus administration of intravenous contrast. CONTRAST:  ISOVUE-300 IOPAMIDOL (ISOVUE-300) INJECTION 61% COMPARISON:  04/06/2016 FINDINGS: Lower chest: The lung bases are clear.  No esophageal distention. Hepatobiliary: Diffuse low attenuation change throughout the liver suggesting fatty infiltration. Filling defect in the distal main portal vein extending into the right branches suggesting portal venous thrombosis. No focal liver lesions are identified. Gallbladder is distended without stone or wall thickening. No bile duct dilatation. Pancreas: Unremarkable. No pancreatic ductal dilatation or surrounding inflammatory changes. Spleen: Normal in size without focal abnormality. Adrenals/Urinary Tract: No adrenal gland nodules. Kidneys are symmetrical. No hydronephrosis or hydroureter. Bladder contains gas. This could result from catheterization, cystitis, or fistula. Correlation with urinalysis is recommended. No bladder wall thickening. Stomach/Bowel: Enteric tube with tip in the stomach. Stomach appears normal. Small bowel are dilated and fluid-filled. Contrast material is demonstrated in the colon, likely from the previous CT scan. There has been interval postoperative change since the previous study. Changes may represent postoperative ileus although residual or recurrent obstruction is not excluded. Small amount of mesenteric edema. Prominent lymph nodes in the mesenteric without pathologic enlargement likely represent reactive nodes. Colon is not abnormally distended. No fluid  collections to suggest abscess. No contrast extravasation. Vascular/Lymphatic: No significant vascular findings are present. No enlarged abdominal or pelvic lymph nodes. Reproductive: Uterus and ovaries are not enlarged. Small amount of free fluid in the pelvis is likely physiologic. Other: A few tiny gas bubbles are demonstrated in the upper abdomen, likely postoperative. Postoperative changes in the anterior abdominal wall with skin clips and linear infiltration along the midline. Small amount of edema in the anterior subcutaneous fat. Musculoskeletal: No acute or significant osseous findings. IMPRESSION: Recent postoperative changes including partial colonic resection. Persistent small bowel dilatation with fluid-filled loops may represent postoperative ileus although residual or recurrent obstruction not excluded. 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 Gas in the bladder may be due to instrumentation or other bladder path biology. These results were called by telephone at the time of interpretation on 03/23/2016 at 6:00 am to Dr. Lucretia Roers, who verbally acknowledged these results. Electronically Signed   By:  Burman Nieves M.D.   On: 03/23/2016 06:14     Assessment and plan- Patient is a 41 y.o. female with acute portal venous thrombosis  Today her h/h is lower at 7.9/ 24.1 from 9.3/28.1 on 2/21. No overt bleeding. Will get CT abdomen with contrast to r/o internal bleeding. Continue lovenox. Will transfuse 1 unit of prbc tomorrow. Risks and benefits of blood trasnfusion discussed with patient and she agrees to proceed. Will also do comprehensive anemia work up including iron studies, b12, folate, tsh, myeloma panel haptoglobin and reticulocyte ciunt today. Will recheck cbc in 2 weeks time. I will cal her with CT results. I will see her after her colonoscopy.   Visit Diagnosis 1. Thrombosis, portal vein   2. Microcytic anemia      Dr. Owens Shark, MD, MPH [facility] Pager- [phone] 04/17/2016 4:38 PM

## 2016-04-18 ENCOUNTER — Other Ambulatory Visit: Payer: Self-pay | Admitting: *Deleted

## 2016-04-18 ENCOUNTER — Inpatient Hospital Stay: Payer: BLUE CROSS/BLUE SHIELD

## 2016-04-18 ENCOUNTER — Telehealth: Payer: Self-pay | Admitting: *Deleted

## 2016-04-18 ENCOUNTER — Ambulatory Visit
Admission: RE | Admit: 2016-04-18 | Discharge: 2016-04-18 | Disposition: A | Discharge status: Home / Self Care | Payer: BLUE CROSS/BLUE SHIELD | Source: Ambulatory Visit | Attending: Oncology | Admitting: Oncology

## 2016-04-18 VITALS — BP 127/79 | HR 97 | Temp 97.7°F | Resp 18

## 2016-04-18 DIAGNOSIS — D509 Iron deficiency anemia, unspecified: Secondary | ICD-10-CM | POA: Insufficient documentation

## 2016-04-18 DIAGNOSIS — R932 Abnormal findings on diagnostic imaging of liver and biliary tract: Secondary | ICD-10-CM | POA: Insufficient documentation

## 2016-04-18 DIAGNOSIS — D649 Anemia, unspecified: Secondary | ICD-10-CM

## 2016-04-18 DIAGNOSIS — D529 Folate deficiency anemia, unspecified: Secondary | ICD-10-CM

## 2016-04-18 DIAGNOSIS — I81 Portal vein thrombosis: Secondary | ICD-10-CM | POA: Insufficient documentation

## 2016-04-18 LAB — ABO/RH: ABO/RH(D): A POS

## 2016-04-18 LAB — PREPARE RBC (CROSSMATCH)

## 2016-04-18 LAB — HAPTOGLOBIN: Haptoglobin: 158 mg/dL (ref 34–200)

## 2016-04-18 MED ORDER — FOLIC ACID 1 MG PO TABS: 1.0000 mg | ORAL_TABLET | Freq: Every day | ORAL | 3 refills | Status: DC

## 2016-04-18 MED ORDER — ACETAMINOPHEN 325 MG PO TABS
650.0000 mg | ORAL_TABLET | Freq: Once | ORAL | Status: AC
  Administered 2016-04-18: 650 mg via ORAL
  Filled 2016-04-18: qty 2

## 2016-04-18 MED ORDER — IOPAMIDOL (ISOVUE-300) INJECTION 61%
100.0000 mL | Freq: Once | INTRAVENOUS | Status: AC | PRN
  Administered 2016-04-18: 100 mL via INTRAVENOUS

## 2016-04-18 MED ORDER — DIPHENHYDRAMINE HCL 12.5 MG/5ML PO ELIX
12.5000 mg | ORAL_SOLUTION | Freq: Once | ORAL | Status: AC
  Administered 2016-04-18: 12.5 mg via ORAL
  Filled 2016-04-18: qty 5

## 2016-04-18 NOTE — Telephone Encounter (Signed)
Called pt to let her know that her lab work showed low iron level and it was rec: by Dr.Rao that she get 2 doses of iron starting next week and then repeat for a total of 2 doses. Her folate level is low and she will need to start on that and it is 1 mg once a day. I will send that to her pharmacy. She is agreeable to the above and when she comes in she will get the appt schedule for feraheme. I have sent rx into her pharmacy for the folic acid

## 2016-04-19 ENCOUNTER — Other Ambulatory Visit: Payer: Self-pay | Admitting: Surgery

## 2016-04-19 LAB — MULTIPLE MYELOMA PANEL, SERUM
Albumin SerPl Elph-Mcnc: 3.7 g/dL (ref 2.9–4.4)
Albumin/Glob SerPl: 1.3 (ref 0.7–1.7)
Alpha 1: 0.3 g/dL (ref 0.0–0.4)
Alpha2 Glob SerPl Elph-Mcnc: 0.7 g/dL (ref 0.4–1.0)
B-Globulin SerPl Elph-Mcnc: 1 g/dL (ref 0.7–1.3)
Gamma Glob SerPl Elph-Mcnc: 1 g/dL (ref 0.4–1.8)
Globulin, Total: 3 g/dL (ref 2.2–3.9)
IgA: 150 mg/dL (ref 87–352)
IgG (Immunoglobin G), Serum: 908 mg/dL (ref 700–1600)
IgM, Serum: 130 mg/dL (ref 26–217)
Total Protein ELP: 6.7 g/dL (ref 6.0–8.5)

## 2016-04-19 LAB — TYPE AND SCREEN
ABO/RH(D): A POS
Antibody Screen: NEGATIVE
Unit division: 0

## 2016-04-19 LAB — BPAM RBC
Blood Product Expiration Date: 201803102359
ISSUE DATE / TIME: 201803071417
Unit Type and Rh: 9500

## 2016-04-23 ENCOUNTER — Telehealth: Payer: Self-pay | Admitting: *Deleted

## 2016-04-23 NOTE — Telephone Encounter (Signed)
Asking for CT results from last Wed.

## 2016-04-24 ENCOUNTER — Other Ambulatory Visit: Payer: Self-pay

## 2016-04-24 NOTE — Telephone Encounter (Signed)
Called the patient and let her know the results. Patient is grateful for good results.

## 2016-04-24 NOTE — Telephone Encounter (Signed)
Can you call patient and let her know no evidence of bleedign on CT. Portal venous clot smaller. Continue lovenox. Await colonoscopy in April to evaluate colitis further by Dr. Servando Snare? Thanks

## 2016-04-25 ENCOUNTER — Inpatient Hospital Stay: Payer: BLUE CROSS/BLUE SHIELD

## 2016-04-25 VITALS — BP 133/92 | HR 88 | Temp 98.1°F | Resp 16

## 2016-04-25 DIAGNOSIS — D509 Iron deficiency anemia, unspecified: Secondary | ICD-10-CM

## 2016-04-25 DIAGNOSIS — I81 Portal vein thrombosis: Secondary | ICD-10-CM | POA: Diagnosis not present

## 2016-04-25 MED ORDER — SODIUM CHLORIDE 0.9 % IV SOLN
Freq: Once | INTRAVENOUS | Status: AC
  Administered 2016-04-25: 15:00:00 via INTRAVENOUS
  Filled 2016-04-25: qty 1000

## 2016-04-25 MED ORDER — SODIUM CHLORIDE 0.9 % IV SOLN
510.0000 mg | Freq: Once | INTRAVENOUS | Status: AC
  Administered 2016-04-25: 510 mg via INTRAVENOUS
  Filled 2016-04-25: qty 17

## 2016-04-26 ENCOUNTER — Encounter: Payer: Self-pay | Admitting: Surgery

## 2016-04-26 ENCOUNTER — Ambulatory Visit (INDEPENDENT_AMBULATORY_CARE_PROVIDER_SITE_OTHER): Payer: BLUE CROSS/BLUE SHIELD | Admitting: Surgery

## 2016-04-26 ENCOUNTER — Telehealth: Payer: Self-pay | Admitting: *Deleted

## 2016-04-26 ENCOUNTER — Telehealth: Payer: Self-pay | Admitting: Cardiovascular Disease

## 2016-04-26 VITALS — BP 141/102 | HR 90 | Temp 98.7°F | Ht 62.0 in | Wt 123.2 lb

## 2016-04-26 DIAGNOSIS — G8929 Other chronic pain: Secondary | ICD-10-CM

## 2016-04-26 DIAGNOSIS — R1031 Right lower quadrant pain: Secondary | ICD-10-CM

## 2016-04-26 NOTE — Patient Instructions (Signed)
Please call our office with any questions or concerns.  Please do not submerge in a tub, hot tub, or pool until incisions are completely sealed.  Use sun block to incision area over the next year if this area will be exposed to sun. This helps decrease scarring.  You may resume your normal activities on 06/11/16 . At that time- Listen to your body when lifting, if you have pain when lifting, stop and then try again in a few days. Pain after doing exercises or activities of daily living is normal as you get back in to your normal routine.  If you develop redness, drainage, or pain at incision sites- call our office immediately and speak with a nurse.  

## 2016-04-26 NOTE — Telephone Encounter (Signed)
Pt c/o BP issue: STAT if pt c/o blurred vision, one-sided weakness or slurred speech  1. What are your last 5 BP readings?  Today is 148/102 Yesterday 135/95   2. Are you having any other symptoms (ex. Dizziness, headache, blurred vision, passed out)? no  3. What is your BP issue? Elevated  Pt states she is on Lovenox due to emergency surgery on 2/3, states she has not taken any BP medication since her surgery. States the last couple days it has gradually been elevated.  States she had an iron infusion yesterday, and a blood transfusion last week

## 2016-04-26 NOTE — Telephone Encounter (Signed)
Reports that she went to see surgeon today and her B/P is up to 158/102. She has stopped her b/p med due to interaction with blood thinners. Asking what to do, go home, relax and recheck? I attempted to call her back and her phone went to message stated she is not accepting calls at this time.

## 2016-04-26 NOTE — Telephone Encounter (Signed)
Patient informed per Chelsea Carroll, she is to contact her PCP. She stated she will call PCP

## 2016-04-26 NOTE — Telephone Encounter (Signed)
Attempted to contact patient. No answer; mailbox is full. Will call again.

## 2016-04-26 NOTE — Progress Notes (Signed)
Outpatient postop visit  04/26/2016  Chelsea Carroll is an 41 y.o. female.    Procedure: rt colon res  OM:BTDHRC  HPI: Patient has tenesmus with bowel movements and her pain is all pelvic at that time no right upper quadrant pain or tenderness. She is eating well. She is currently anticoagulated and having a very heavy menstrual cycle. She also has elevated blood pressures today  Medications reviewed.    Physical Exam:  There were no vitals taken for this visit.    PE: No distress vital signs show elevated blood pressure Abdomen soft nondistended nontympanitic and nontender wound is clean no erythema no drainage    Assessment/Plan:  Patient with multiple problems including high blood pressure, anticoagulation portal vein thrombosis, some form of bowel disease that caused this bowel obstruction and scar furcation without clear signs of IBD. Patient is seeing Dr. Smith Robert and Dr. Servando Snare. No need for further surgical follow-up at this point patient wants to go back to work on Monday.  Lattie Haw, MD, FACS

## 2016-04-27 ENCOUNTER — Other Ambulatory Visit: Payer: BLUE CROSS/BLUE SHIELD

## 2016-04-27 ENCOUNTER — Ambulatory Visit: Payer: BLUE CROSS/BLUE SHIELD | Admitting: Surgery

## 2016-04-27 ENCOUNTER — Ambulatory Visit: Payer: BLUE CROSS/BLUE SHIELD | Admitting: Oncology

## 2016-04-27 ENCOUNTER — Inpatient Hospital Stay: Payer: BLUE CROSS/BLUE SHIELD

## 2016-04-27 DIAGNOSIS — D509 Iron deficiency anemia, unspecified: Secondary | ICD-10-CM

## 2016-04-27 DIAGNOSIS — I81 Portal vein thrombosis: Secondary | ICD-10-CM

## 2016-04-27 LAB — CBC
HCT: 29.5 % — ABNORMAL LOW (ref 35.0–47.0)
Hemoglobin: 9.7 g/dL — ABNORMAL LOW (ref 12.0–16.0)
MCH: 26.5 pg (ref 26.0–34.0)
MCHC: 32.8 g/dL (ref 32.0–36.0)
MCV: 80.8 fL (ref 80.0–100.0)
Platelets: 329 10*3/uL (ref 150–440)
RBC: 3.65 MIL/uL — ABNORMAL LOW (ref 3.80–5.20)
RDW: 17.6 % — ABNORMAL HIGH (ref 11.5–14.5)
WBC: 4.3 10*3/uL (ref 3.6–11.0)

## 2016-04-27 NOTE — Telephone Encounter (Signed)
No answer voicemail is full. 

## 2016-04-30 NOTE — Telephone Encounter (Signed)
Attempted to contact pt. No answer, mailbox is full.

## 2016-05-02 ENCOUNTER — Other Ambulatory Visit: Payer: Self-pay | Admitting: Cardiovascular Disease

## 2016-05-02 ENCOUNTER — Inpatient Hospital Stay: Payer: BLUE CROSS/BLUE SHIELD

## 2016-05-02 DIAGNOSIS — R079 Chest pain, unspecified: Secondary | ICD-10-CM

## 2016-05-10 ENCOUNTER — Inpatient Hospital Stay (HOSPITAL_BASED_OUTPATIENT_CLINIC_OR_DEPARTMENT_OTHER): Payer: BLUE CROSS/BLUE SHIELD | Admitting: Oncology

## 2016-05-10 ENCOUNTER — Other Ambulatory Visit: Payer: BLUE CROSS/BLUE SHIELD

## 2016-05-10 VITALS — BP 147/93 | HR 99 | Temp 95.3°F | Resp 18 | Wt 126.2 lb

## 2016-05-10 DIAGNOSIS — K529 Noninfective gastroenteritis and colitis, unspecified: Secondary | ICD-10-CM

## 2016-05-10 DIAGNOSIS — Z8669 Personal history of other diseases of the nervous system and sense organs: Secondary | ICD-10-CM

## 2016-05-10 DIAGNOSIS — Z79899 Other long term (current) drug therapy: Secondary | ICD-10-CM

## 2016-05-10 DIAGNOSIS — E785 Hyperlipidemia, unspecified: Secondary | ICD-10-CM | POA: Diagnosis not present

## 2016-05-10 DIAGNOSIS — I81 Portal vein thrombosis: Secondary | ICD-10-CM

## 2016-05-10 DIAGNOSIS — R590 Localized enlarged lymph nodes: Secondary | ICD-10-CM | POA: Diagnosis not present

## 2016-05-10 DIAGNOSIS — I10 Essential (primary) hypertension: Secondary | ICD-10-CM | POA: Diagnosis not present

## 2016-05-10 DIAGNOSIS — D649 Anemia, unspecified: Secondary | ICD-10-CM

## 2016-05-10 DIAGNOSIS — K5090 Crohn's disease, unspecified, without complications: Secondary | ICD-10-CM | POA: Diagnosis not present

## 2016-05-10 DIAGNOSIS — Z7901 Long term (current) use of anticoagulants: Secondary | ICD-10-CM

## 2016-05-10 DIAGNOSIS — R002 Palpitations: Secondary | ICD-10-CM

## 2016-05-10 DIAGNOSIS — D509 Iron deficiency anemia, unspecified: Secondary | ICD-10-CM | POA: Diagnosis not present

## 2016-05-10 DIAGNOSIS — K561 Intussusception: Secondary | ICD-10-CM

## 2016-05-10 LAB — BASIC METABOLIC PANEL
Anion gap: 4 — ABNORMAL LOW (ref 5–15)
BUN: 9 mg/dL (ref 6–20)
CO2: 26 mmol/L (ref 22–32)
Calcium: 9.2 mg/dL (ref 8.9–10.3)
Chloride: 106 mmol/L (ref 101–111)
Creatinine, Ser: 0.62 mg/dL (ref 0.44–1.00)
GFR calc Af Amer: >60 mL/min (ref >60)
GFR calc non Af Amer: >60 mL/min (ref >60)
Glucose, Bld: 92 mg/dL (ref 65–99)
Potassium: 3.8 mmol/L (ref 3.5–5.1)
Sodium: 136 mmol/L (ref 135–145)

## 2016-05-10 LAB — CBC
HCT: 34.3 % — ABNORMAL LOW (ref 35.0–47.0)
Hemoglobin: 11.2 g/dL — ABNORMAL LOW (ref 12.0–16.0)
MCH: 27.5 pg (ref 26.0–34.0)
MCHC: 32.6 g/dL (ref 32.0–36.0)
MCV: 84.3 fL (ref 80.0–100.0)
Platelets: 289 10*3/uL (ref 150–440)
RBC: 4.07 MIL/uL (ref 3.80–5.20)
RDW: 21.7 % — ABNORMAL HIGH (ref 11.5–14.5)
WBC: 4.9 10*3/uL (ref 3.6–11.0)

## 2016-05-10 NOTE — Progress Notes (Signed)
Hematology/Oncology Consult note [facility]  Telephone:([phone] [phone]  Patient Care Team: Lyndon Code, MD as PCP - General (Internal Medicine) Lyndon Code, MD (Internal Medicine)   Name of the patient: Chelsea Carroll  [id number]  1975-10-18   Date of visit: 05/10/16  Diagnosis- acute portal venous thrombossi  Chief complaint/ Reason for visit- routine f/u  Heme/Onc history: Patient is a 41 year old female with a possible history of Crohn's disease in the past. She has [facility] and has not benign treatment recently. She presented to the hospital on 03/17/2016 with acute abdominal pain. CT abdomen and pelvis done without contrast showed acute small bowel obstruction with transition point in the pelvic small bowel where there appears to be enteroenteric intussusception. Mildly inflamed appearing distal small bowel. Patient was taken to the OR and underwent extremity laparotomy and right hemicolectomy. Pathology showed acute enteritis but features that were not entirely compatible with IBD. Post op day 2 patient had repeat abdominal pain and repeat CT abdomen showed portal venous thrombosis. Patient was started on lovenox  On 03/30/2016 patient was switched to Xarelto with the plan to continue anticoagulation for 3-6 months  On 04/05/16 patient was switched back to lovenox as she was having side effects including abdominal pain and intermittent bleeding  Seen by Dr. Servando Snare from GI on 04/10/16 and plan is for repeat colonoscopy in April 2018  Patient then developed worsening iron deficiency anemia and required 1 unit of blood transfusion as well as 1 dose of IV iron.  Interval history- patient's colonoscopy is scheduled for end of April. She is tolerating her Lovenox well and reports no bleeding or bruising. She does have on and off abdominal pain.  ECOG PS- 1 Pain scale- 2   Review of systems-  Review of Systems  Constitutional: Negative for chills, fever, malaise/fatigue and weight loss.  HENT: Negative for congestion, ear discharge and nosebleeds.   Eyes: Negative for blurred vision.  Respiratory: Negative for cough, hemoptysis, sputum production, shortness of breath and wheezing.   Cardiovascular: Negative for chest pain, palpitations, orthopnea and claudication.  Gastrointestinal: Positive for abdominal pain. Negative for blood in stool, constipation, diarrhea, heartburn, melena, nausea and vomiting.  Genitourinary: Negative for dysuria, flank pain, frequency, hematuria and urgency.  Musculoskeletal: Negative for back pain, joint pain and myalgias.  Skin: Negative for rash.  Neurological: Negative for dizziness, tingling, focal weakness, seizures, weakness and headaches.  Endo/Heme/Allergies: Does not bruise/bleed easily.  Psychiatric/Behavioral: Negative for depression and suicidal ideas. The patient does not have insomnia.      Current treatment- lovenox  Allergies  Allergen Reactions  . Iodine Hives  . Codeine Other (See Comments)    Other reaction(s): Unknown     Past Medical History:  Diagnosis Date  . Anemia   . Chest pain 06/19/2013  . Epigastric pain   . H/O blood clots   . Heart palpitations 06/19/2013  . HTN (hypertension) 06/19/2013  . Hyperlipidemia 06/19/2013  . Hypertension   . Intussusception intestine (HCC)   . Menstrual migraine 07/27/2015  . Migraines   . SBO (small bowel obstruction) 03/17/2016  . Thrombosis, portal vein      Past Surgical History:  Procedure Laterality Date  . LAPAROTOMY N/A 03/17/2016   Procedure: EXPLORATORY LAPAROTOMY,right colon resection.terminal resection,appendectomy;  Surgeon: Lattie Haw, MD;  Location: [facility] ORS;  Service: General;  Laterality: N/A;    Social History   Social History  .  Marital status: Married    Spouse name: N/A  . Number of children: N/A  . Years of education: N/A   Occupational History  .  Not on file.   Social History Main Topics  . Smoking status: Never Smoker  . Smokeless tobacco: Never Used  . Alcohol use Yes     Comment: occasional  . Drug use: No  . Sexual activity: Not on file   Other Topics Concern  . Not on file   Social History Narrative  . No narrative on file    Family History  Problem Relation Age of Onset  . Hyperlipidemia Mother   . Hypertension Mother      Current Outpatient Prescriptions:  .  enoxaparin (LOVENOX) 80 MG/0.8ML injection, Inject 80 mg into the skin daily., Disp: , Rfl:  .  carvedilol (COREG) 6.25 MG tablet, Take 1 tablet (6.25 mg total) by mouth 2 (two) times daily. (Patient not taking: Reported on 04/05/2016), Disp: 60 tablet, Rfl: 11 .  folic acid (FOLVITE) 1 MG tablet, Take 1 tablet (1 mg total) by mouth daily. (Patient not taking: Reported on 05/10/2016), Disp: 30 tablet, Rfl: 3 .  ibuprofen (ADVIL,MOTRIN) 200 MG tablet, Take 200 mg by mouth every 6 (six) hours as needed., Disp: , Rfl:  .  lisinopril (PRINIVIL,ZESTRIL) 5 MG tablet, Take 1 tablet (5 mg total) by mouth daily. (Patient not taking: Reported on 04/05/2016), Disp: 30 tablet, Rfl: 5 .  Multiple Vitamin (MULTIVITAMIN) tablet, Take 1 tablet by mouth daily., Disp: , Rfl:   Physical exam:  Vitals:   05/10/16 1100 05/10/16 1137  BP: (!) 165/119 (!) 147/93  Pulse: 87 99  Resp: 18   Temp: (!) 95.3 F (35.2 C)   TempSrc: Tympanic   Weight: 126 lb 3.2 oz (57.2 kg)    Physical Exam  Constitutional: She is oriented to person, place, and time and well-developed, well-nourished, and in no distress.  HENT:  Head: Normocephalic and atraumatic.  Eyes: EOM are normal. Pupils are equal, round, and reactive to light.  Neck: Normal range of motion.  Cardiovascular: Normal rate, regular rhythm and normal heart sounds.   Pulmonary/Chest: Effort normal and breath sounds normal.  Abdominal: Soft. Bowel sounds are normal. There is tenderness (TTP IN RLQ).  Neurological: She is alert  and oriented to person, place, and time.  Skin: Skin is warm and dry.     CMP Latest Ref Rng & Units 03/26/2016  Glucose 65 - 99 mg/dL 161(W)  BUN 6 - 20 mg/dL <9(U)  Creatinine 0.45 - 1.00 mg/dL 4.09  Sodium 811 - 914 mmol/L 136  Potassium 3.5 - 5.1 mmol/L 4.0  Chloride 101 - 111 mmol/L 102  CO2 22 - 32 mmol/L 28  Calcium 8.9 - 10.3 mg/dL 8.2(L)  Total Protein 6.5 - 8.1 g/dL -  Total Bilirubin 0.3 - 1.2 mg/dL -  Alkaline Phos 38 - 782 U/L -  AST 15 - 41 U/L -  ALT 14 - 54 U/L -   CBC Latest Ref Rng & Units 04/27/2016  WBC 3.6 - 11.0 K/uL 4.3  Hemoglobin 12.0 - 16.0 g/dL 9.5(A)  Hematocrit 21.3 - 47.0 % 29.5(L)  Platelets 150 - 440 K/uL 329    No images are attached to the encounter.  Ct Abdomen W Contrast  Result Date: 04/18/2016 CLINICAL DATA:  Microcytic anemia. History of portal vein thrombosis. EXAM: CT ABDOMEN WITH CONTRAST TECHNIQUE: Multidetector CT imaging of the abdomen was performed using the standard protocol following bolus administration  of intravenous contrast. CONTRAST:  ISOVUE-300 IOPAMIDOL (ISOVUE-300) INJECTION 61% COMPARISON:  03/23/2016 FINDINGS: Lower chest: No acute abnormality. Hepatobiliary: Wedge-shaped area of relative increased attenuation within the anterior right lobe of liver is identified in likely reflects altered profusion secondary to resolving portal vein thrombosis. Since the previous exam there has been significant decrease in volume of thrombus within the portal vein with a few tiny remaining areas of thrombus within small peripheral branches, image number 12 of series 2. No suspicious liver mass identified. The gallbladder appears within normal limits. Pancreas: Unremarkable. No pancreatic ductal dilatation or surrounding inflammatory changes. Spleen: Normal in size without focal abnormality. Adrenals/Urinary Tract: Adrenal glands are unremarkable. Kidneys are normal, without renal calculi, focal lesion, or hydronephrosis. Stomach/Bowel: The  stomach is normal. No pathologic dilatation of the large or small bowel loops. The patient is status post right hemicolectomy with entero colonic anastomosis. At the anastomotic site there is wall thickening, image 41 of series 2. Vascular/Lymphatic: Normal appearance of the abdominal aorta. Prominent upper abdominal lymph nodes are identified. No pathologic adenopathy however. Other: No free fluid or fluid collections. Musculoskeletal: No acute or significant osseous findings. IMPRESSION: 1. There has been interval regression of previously identified portal vein thrombosis. Associated area of relative increased attenuation within the anterior right lobe of liver likely reflects altered profusion as opposed to mass lesion. 2. There is wall thickening at the entero colonic anastomosis within the right hemiabdomen. Nonspecific. Findings may reflect inflammation secondary to suspected inflammatory bowel disease. In a patient with microcytic anemia underline gastrointestinal neoplasm cannot be excluded. Consider correlation with direct visualization. 3. Prominent but non pathologically enlarged retroperitoneal lymph nodes are identified and nonspecific. Electronically Signed   By: Signa Kell M.D.   On: 04/18/2016 10:26     Assessment and plan- Patient is a 41 y.o. female with a history of acute portal venous thromboses in the setting of possible inflammatory bowel disease  Patient will continue Lovenox at least 43 is not 6 months. We will check a repeat CBC today. She will get her second dose of Feraheme next week. I will see her back in about 6 weeks time with repeat CBC and iron studies after her colonoscopy.  Hypertension- she will follow-up with her cardiologist Dr. Kirke Corin for the same. We will touch base with his clinic as well   Visit Diagnosis 1. Iron deficiency anemia, unspecified iron deficiency anemia type   2. Thrombosis, portal vein      Dr. Owens Shark, MD, MPH [facility] Pager- [phone] 05/10/2016 11:56 AM

## 2016-05-10 NOTE — Progress Notes (Signed)
Here for follow up. BP elevated 165/119. Per pt had been on BP meds-last taken Jan/18  Will re check CAW

## 2016-05-17 ENCOUNTER — Ambulatory Visit (INDEPENDENT_AMBULATORY_CARE_PROVIDER_SITE_OTHER): Payer: BLUE CROSS/BLUE SHIELD

## 2016-05-17 ENCOUNTER — Telehealth: Payer: Self-pay | Admitting: Cardiovascular Disease

## 2016-05-17 ENCOUNTER — Other Ambulatory Visit: Payer: Self-pay

## 2016-05-17 ENCOUNTER — Inpatient Hospital Stay: Payer: BLUE CROSS/BLUE SHIELD | Attending: Oncology

## 2016-05-17 DIAGNOSIS — R079 Chest pain, unspecified: Secondary | ICD-10-CM | POA: Diagnosis not present

## 2016-05-17 MED ORDER — LISINOPRIL 5 MG PO TABS: 5.0000 mg | ORAL_TABLET | Freq: Every day | ORAL | 5 refills | Status: AC

## 2016-05-17 MED ORDER — CARVEDILOL 6.25 MG PO TABS: 6.2500 mg | ORAL_TABLET | Freq: Two times a day (BID) | ORAL | 1 refills | Status: AC

## 2016-05-17 NOTE — Telephone Encounter (Signed)
Pt in office for echo and questions if she should take lisinopril and coreg while taking lovenox injections for portal vein thrombosis.  She states someone told her not to take BP medications with lovenox but she can not recall who it was. Reports BP160/110 at a recent doctor visit. Reviewed with Ward Givens, NP, who confirms pt may resume lisinopril 5mg  QD and coreg 6.25mg  BID. Per pt request, I have left a detailed VM message on her work number confirming she may take BP medications.

## 2016-05-29 ENCOUNTER — Encounter: Payer: Self-pay | Admitting: Cardiovascular Disease

## 2016-05-29 ENCOUNTER — Ambulatory Visit: Payer: BLUE CROSS/BLUE SHIELD | Admitting: Cardiovascular Disease

## 2016-06-05 ENCOUNTER — Other Ambulatory Visit: Payer: Self-pay

## 2016-06-05 DIAGNOSIS — K50919 Crohn's disease, unspecified, with unspecified complications: Secondary | ICD-10-CM

## 2016-06-19 ENCOUNTER — Encounter: Payer: Self-pay | Admitting: *Deleted

## 2016-06-20 ENCOUNTER — Encounter: Payer: Self-pay | Admitting: Anesthesiology

## 2016-06-21 NOTE — Discharge Instructions (Signed)

## 2016-06-28 ENCOUNTER — Inpatient Hospital Stay: Payer: BLUE CROSS/BLUE SHIELD

## 2016-06-28 ENCOUNTER — Inpatient Hospital Stay: Payer: BLUE CROSS/BLUE SHIELD | Admitting: Oncology

## 2016-07-06 ENCOUNTER — Ambulatory Visit
Admission: RE | Admit: 2016-07-06 | Payer: BLUE CROSS/BLUE SHIELD | Source: Ambulatory Visit | Admitting: Gastroenterology

## 2016-07-06 HISTORY — DX: Anesthesia of skin: R20.0

## 2016-07-06 HISTORY — DX: Paresthesia of skin: R20.2

## 2016-07-06 SURGERY — COLONOSCOPY WITH PROPOFOL
Anesthesia: Choice

## 2016-07-12 ENCOUNTER — Inpatient Hospital Stay: Payer: BLUE CROSS/BLUE SHIELD | Admitting: Oncology

## 2016-07-12 ENCOUNTER — Inpatient Hospital Stay: Payer: BLUE CROSS/BLUE SHIELD

## 2016-07-12 NOTE — Progress Notes (Deleted)
Hematology/Oncology Consult note [facility]  Telephone:([phone] [phone]  Patient Care Team: Lyndon Code, MD as PCP - General (Internal Medicine) Lyndon Code, MD (Internal Medicine)   Name of the patient: Chelsea Carroll  [id number]  February 21, 1975   Date of visit: 07/12/16  Diagnosis- acute portal venous thrombosis  Chief complaint/ Reason for visit- routine f/u  Heme/Onc history: Patient is a 41 year old female with a possible history of Crohn's disease in the past. She has seen [facility] and has not benign treatment recently. She presented to the hospital on 03/17/2016 with acute abdominal pain. CT abdomen and pelvis done without contrast showed acute small bowel obstruction with transition point in the pelvic small bowel where there appears to be enteroenteric intussusception. Mildly inflamed appearing distal small bowel. Patient was taken to the OR and underwent extremity laparotomy and right hemicolectomy. Pathology showed acute enteritis but features that were not entirely compatible with IBD. Post op day 2 patient had repeat abdominal pain and repeat CT abdomen showed portal venous thrombosis. Patient was started on lovenox  On 03/30/2016 patient was switched to Xarelto with the plan to continue anticoagulation for 3-6 months  On 04/05/16 patient was switched back to lovenox as she was having side effects including abdominal pain and intermittent bleeding. She stopped lovenox after 3 months of anticoagulation    Interval history- ***  ECOG PS- *** Pain scale- *** Opioid associated constipation- ***  Review of systems- Review of Systems  Constitutional: Negative for chills, fever, malaise/fatigue and weight loss.  HENT: Negative for congestion, ear discharge and nosebleeds.   Eyes: Negative for blurred vision.  Respiratory: Negative for cough, hemoptysis, sputum production, shortness of breath  and wheezing.   Cardiovascular: Negative for chest pain, palpitations, orthopnea and claudication.  Gastrointestinal: Negative for abdominal pain, blood in stool, constipation, diarrhea, heartburn, melena, nausea and vomiting.  Genitourinary: Negative for dysuria, flank pain, frequency, hematuria and urgency.  Musculoskeletal: Negative for back pain, joint pain and myalgias.  Skin: Negative for rash.  Neurological: Negative for dizziness, tingling, focal weakness, seizures, weakness and headaches.  Endo/Heme/Allergies: Does not bruise/bleed easily.  Psychiatric/Behavioral: Negative for depression and suicidal ideas. The patient does not have insomnia.        Allergies  Allergen Reactions  . Iodine Hives    Contrast medium only  . Contrast Media [Iodinated Diagnostic Agents] Hives     Past Medical History:  Diagnosis Date  . Anemia   . Bilateral arm numbness and tingling while sleeping    pt suspects pinched nerves in neck  . Chest pain 06/19/2013  . Epigastric pain   . H/O blood clots   . Heart palpitations 06/19/2013  . HTN (hypertension) 06/19/2013  . Hyperlipidemia 06/19/2013  . Hypertension   . Intussusception intestine (HCC)   . Menstrual migraine 07/27/2015  . Migraines   . SBO (small bowel obstruction) (HCC) 03/17/2016  . Thrombosis, portal vein      Past Surgical History:  Procedure Laterality Date  . LAPAROTOMY N/A 03/17/2016   Procedure: EXPLORATORY LAPAROTOMY,right colon resection.terminal resection,appendectomy;  Surgeon: Lattie Haw, MD;  Location: [facility] ORS;  Service: General;  Laterality: N/A;    Social History   Social History  . Marital status: Married    Spouse name: N/A  . Number of children: N/A  . Years of education: N/A   Occupational History  . Not on file.   Social History Main Topics  .  Smoking status: Never Smoker  . Smokeless tobacco: Never Used  . Alcohol use Yes     Comment: occasional  . Drug use: No  . Sexual activity: Not on file     Other Topics Concern  . Not on file   Social History Narrative  . No narrative on file    Family History  Problem Relation Age of Onset  . Hyperlipidemia Mother   . Hypertension Mother      Current Outpatient Prescriptions:  .  carvedilol (COREG) 6.25 MG tablet, Take 1 tablet (6.25 mg total) by mouth 2 (two) times daily., Disp: 60 tablet, Rfl: 1 .  enoxaparin (LOVENOX) 80 MG/0.8ML injection, Inject 80 mg into the skin daily., Disp: , Rfl:  .  folic acid (FOLVITE) 1 MG tablet, Take 1 tablet (1 mg total) by mouth daily. (Patient not taking: Reported on 05/10/2016), Disp: 30 tablet, Rfl: 3 .  ibuprofen (ADVIL,MOTRIN) 200 MG tablet, Take 200 mg by mouth every 6 (six) hours as needed., Disp: , Rfl:  .  lisinopril (PRINIVIL,ZESTRIL) 5 MG tablet, Take 1 tablet (5 mg total) by mouth daily., Disp: 30 tablet, Rfl: 5 .  Multiple Vitamin (MULTIVITAMIN) tablet, Take 1 tablet by mouth daily., Disp: , Rfl:   Physical exam: There were no vitals filed for this visit. Physical Exam  Constitutional: She is oriented to person, place, and time and well-developed, well-nourished, and in no distress.  HENT:  Head: Normocephalic and atraumatic.  Eyes: EOM are normal. Pupils are equal, round, and reactive to light.  Neck: Normal range of motion.  Cardiovascular: Normal rate, regular rhythm and normal heart sounds.   Pulmonary/Chest: Effort normal and breath sounds normal.  Abdominal: Soft. Bowel sounds are normal.  Neurological: She is alert and oriented to person, place, and time.  Skin: Skin is warm and dry.     CMP Latest Ref Rng & Units 05/10/2016  Glucose 65 - 99 mg/dL 92  BUN 6 - 20 mg/dL 9  Creatinine 1.61 - 0.96 mg/dL 0.45  Sodium 409 - 811 mmol/L 136  Potassium 3.5 - 5.1 mmol/L 3.8  Chloride 101 - 111 mmol/L 106  CO2 22 - 32 mmol/L 26  Calcium 8.9 - 10.3 mg/dL 9.2  Total Protein 6.5 - 8.1 g/dL -  Total Bilirubin 0.3 - 1.2 mg/dL -  Alkaline Phos 38 - 914 U/L -  AST 15 - 41 U/L -   ALT 14 - 54 U/L -   CBC Latest Ref Rng & Units 05/10/2016  WBC 3.6 - 11.0 K/uL 4.9  Hemoglobin 12.0 - 16.0 g/dL 11.2(L)  Hematocrit 35.0 - 47.0 % 34.3(L)  Platelets 150 - 440 K/uL 289      Assessment and plan- Patient is a 41 y.o. female who sees me for:  1. Acute portal venous thrombosis in the setting of acute enteritis. S/p 3 months fo lovenox. Patient stopped AC after that  2. Iron deficiency anemia-    Visit Diagnosis 1. Iron deficiency anemia, unspecified iron deficiency anemia type   2. Thrombosis, portal vein      Dr. Owens Shark, MD, MPH [facility] [place] Pager- [phone] 07/12/2016 7:45 AM

## 2016-07-31 ENCOUNTER — Encounter: Payer: Self-pay | Admitting: *Deleted

## 2016-07-31 ENCOUNTER — Inpatient Hospital Stay: Payer: BLUE CROSS/BLUE SHIELD

## 2016-07-31 ENCOUNTER — Inpatient Hospital Stay: Payer: BLUE CROSS/BLUE SHIELD | Admitting: Oncology

## 2016-08-14 NOTE — Telephone Encounter (Signed)
done

## 2016-09-06 ENCOUNTER — Telehealth: Payer: Self-pay | Admitting: Cardiovascular Disease

## 2016-09-06 NOTE — Telephone Encounter (Signed)
3 attempts to schedule fu appt from recall list.   Deleting recall.   

## 2016-09-14 ENCOUNTER — Telehealth: Payer: Self-pay | Admitting: Gastroenterology

## 2016-09-14 NOTE — Telephone Encounter (Signed)
Patient Chelsea Carroll that she needs to see Dr. Servando Snare next week. Had a resection Feb 2018. She is starting to have pain again. Please call patient.

## 2016-09-25 NOTE — Telephone Encounter (Signed)
Left vm for pt to return my call.  

## 2016-10-23 ENCOUNTER — Ambulatory Visit: Payer: BLUE CROSS/BLUE SHIELD | Admitting: Nurse Practitioner

## 2016-10-24 ENCOUNTER — Encounter: Payer: Self-pay | Admitting: Nurse Practitioner

## 2017-04-02 ENCOUNTER — Encounter: Payer: Self-pay | Admitting: Obstetrics and Gynecology

## 2017-04-02 ENCOUNTER — Ambulatory Visit (INDEPENDENT_AMBULATORY_CARE_PROVIDER_SITE_OTHER): Payer: Self-pay | Admitting: Obstetrics and Gynecology

## 2017-04-02 VITALS — BP 100/80 | Ht 62.0 in | Wt 135.0 lb

## 2017-04-02 DIAGNOSIS — Z1231 Encounter for screening mammogram for malignant neoplasm of breast: Secondary | ICD-10-CM

## 2017-04-02 DIAGNOSIS — Z01419 Encounter for gynecological examination (general) (routine) without abnormal findings: Secondary | ICD-10-CM

## 2017-04-02 DIAGNOSIS — R102 Pelvic and perineal pain: Secondary | ICD-10-CM

## 2017-04-02 DIAGNOSIS — F3281 Premenstrual dysphoric disorder: Secondary | ICD-10-CM | POA: Insufficient documentation

## 2017-04-02 DIAGNOSIS — Z1239 Encounter for other screening for malignant neoplasm of breast: Secondary | ICD-10-CM

## 2017-04-02 DIAGNOSIS — Z3009 Encounter for other general counseling and advice on contraception: Secondary | ICD-10-CM

## 2017-04-02 NOTE — Patient Instructions (Addendum)
I value your feedback and entrusting us with your care. If you get a [facility] patient survey, I would appreciate you taking the time to let us know about your experience today. Thank you!  [facility]: [phone]  [facility]: [phone]  

## 2017-04-02 NOTE — Progress Notes (Signed)
PCP:  [name], MD   Chief Complaint  Patient presents with  . Gynecologic Exam    HPI:      [name] is a 42 y.o. [phone] who LMP was Patient's last menstrual period was 03/24/2017., presents today for her annual examination.  Her menses are regular every 28-30 days, lasting 1-1 1/2 days now (used to be 5).  Dysmenorrhea moderate, occurring premenstrually. Can't take NSAIDs due to bowel resection, but tries tylenol and heating pad with some relief. Has about a wk of sharp shooting pains suprapubic area. Also has extreme fatigue, emotional lability and breast tenderness for a wk before menses. Sx about 4-5 cycles now. She does not have intermenstrual bleeding.  Sex activity: single partner, contraception - condoms. Has considered prog only options in past; husband won't have vasectomy. Occas has dyspareunia. Last Pap: May 02, 2015  Results were: no abnormalities /neg HPV DNA  Hx of STDs: none  Last mammogram: never There is no FH of breast cancer. There is no FH of ovarian cancer. The patient does do self-breast exams.  Tobacco use: The patient denies current or previous tobacco use. Alcohol use: none No drug use.  Exercise: moderately active  She does get adequate calcium and Vitamin D in her diet.  Labs with PCP. HTN with PCP. Had to have emergency bowel resection last yr due to ruptured appendix with scarring/toxicity.    Past Medical History:  Diagnosis Date  . Anemia   . Bilateral arm numbness and tingling while sleeping    pt suspects pinched nerves in neck  . Chest pain 06/19/2013  . Epigastric pain   . H/O blood clots   . Heart palpitations 06/19/2013  . HTN (hypertension) 06/19/2013  . Hyperlipidemia 06/19/2013  . Hypertension   . Intussusception intestine (HCC)   . Menstrual migraine 07/27/2015  . Migraines   . SBO (small bowel obstruction) (HCC) 03/17/2016  . Thrombosis, portal vein     Past Surgical History:  Procedure Laterality Date  .  APPENDECTOMY    . BOWEL RESECTION  2018  . LAPAROTOMY N/A 03/17/2016   Procedure: EXPLORATORY LAPAROTOMY,right colon resection.terminal resection,appendectomy;  Surgeon: Lattie Haw, MD;  Location: [facility] ORS;  Service: General;  Laterality: N/A;    Family History  Problem Relation Age of Onset  . Hyperlipidemia Mother   . Hypertension Mother     Social History   Socioeconomic History  . Marital status: Married    Spouse name: Not on file  . Number of children: Not on file  . Years of education: Not on file  . Highest education level: Not on file  Social Needs  . Financial resource strain: Not on file  . Food insecurity - worry: Not on file  . Food insecurity - inability: Not on file  . Transportation needs - medical: Not on file  . Transportation needs - non-medical: Not on file  Occupational History  . Not on file  Tobacco Use  . Smoking status: Never Smoker  . Smokeless tobacco: Never Used  Substance and Sexual Activity  . Alcohol use: Yes    Comment: occasional  . Drug use: No  . Sexual activity: Yes    Birth control/protection: None, Condom  Other Topics Concern  . Not on file  Social History Narrative  . Not on file    Current Meds  Medication Sig  . carvedilol (COREG) 6.25 MG tablet Take 1 tablet (6.25 mg total) by mouth 2 (two) times  daily.  . lisinopril (PRINIVIL,ZESTRIL) 5 MG tablet Take 1 tablet (5 mg total) by mouth daily.  . Multiple Vitamin (MULTIVITAMIN) tablet Take 1 tablet by mouth daily.     ROS:  Review of Systems  Constitutional: Positive for fatigue. Negative for fever and unexpected weight change.  Respiratory: Negative for cough, shortness of breath and wheezing.   Cardiovascular: Negative for chest pain, palpitations and leg swelling.  Gastrointestinal: Negative for blood in stool, constipation, diarrhea, nausea and vomiting.  Endocrine: Negative for cold intolerance, heat intolerance and polyuria.  Genitourinary: Positive for pelvic  pain. Negative for dyspareunia, dysuria, flank pain, frequency, genital sores, hematuria, menstrual problem, urgency, vaginal bleeding, vaginal discharge and vaginal pain.  Musculoskeletal: Negative for back pain, joint swelling and myalgias.  Skin: Negative for rash.  Neurological: Negative for dizziness, syncope, light-headedness, numbness and headaches.  Hematological: Negative for adenopathy.  Psychiatric/Behavioral: Negative for agitation, confusion, sleep disturbance and suicidal ideas. The patient is not nervous/anxious.      Objective: BP 100/80   Ht 5\' 2"  (1.575 m)   Wt 135 lb (61.2 kg)   LMP 03/24/2017   BMI 24.69 kg/m    Physical Exam  Constitutional: She is oriented to person, place, and time. She appears well-developed and well-nourished.  Genitourinary: Vagina normal and uterus normal. There is no rash or tenderness on the right labia. There is no rash or tenderness on the left labia. No erythema or tenderness in the vagina. No vaginal discharge found. Right adnexum does not display mass and does not display tenderness. Left adnexum does not display mass and does not display tenderness. Cervix does not exhibit motion tenderness or polyp. Uterus is not enlarged or tender.  Neck: Normal range of motion. No thyromegaly present.  Cardiovascular: Normal rate, regular rhythm and normal heart sounds.  No murmur heard. Pulmonary/Chest: Effort normal and breath sounds normal. Right breast exhibits no mass, no nipple discharge, no skin change and no tenderness. Left breast exhibits no mass, no nipple discharge, no skin change and no tenderness.  Abdominal: Soft. There is no tenderness. There is no guarding.  Musculoskeletal: Normal range of motion.  Neurological: She is alert and oriented to person, place, and time. No cranial nerve deficit.  Psychiatric: She has a normal mood and affect. Her behavior is normal.  Vitals reviewed.   Assessment/Plan: Encounter for annual routine  gynecological examination  Screening for breast cancer - Pt to sched mammo. - Plan: MM DIGITAL SCREENING BILATERAL  Encounter for other general counseling or advice on contraception - Prog only methods discussed. Pt to consider and f/u. Prob not best candidate for TL given bowel resection and scarring.  Pelvic pain - 1 wk before menses. Check GYN u/s with sx. Will call with results. If neg, quesiton related to abd surg.  - Plan: US PELVIS TRANSVANGINAL NON-OB (TV ONLY)  PMDD (premenstrual dysphoric disorder) - Can try prog only systemic BC to see if helps. Had bad reaction to prozac with PCP.   GYN counsel breast self exam, mammography screening, family planning choices, adequate intake of calcium and vitamin D, diet and exercise     F/U  Return in about 1 year (around 04/02/2018).  Brennley Curtice B. Gracee Ratterree, PA-C 04/02/2017 9:11 AM

## 2017-04-03 ENCOUNTER — Telehealth: Payer: Self-pay | Admitting: Obstetrics and Gynecology

## 2017-04-03 NOTE — Telephone Encounter (Signed)
Attempt to reach reach to schedule gyn u/s and follow up with [facility]. Voicemail box is full unable to leave voicemail

## 2017-04-07 ENCOUNTER — Telehealth: Payer: Self-pay | Admitting: *Deleted

## 2017-04-07 NOTE — Telephone Encounter (Signed)
I mailed a no show letter to patient by certified mail on July 31, 2016 for more than 3 no shows to her md appts and iron infusion appts. I received the certified letter back and it was dated 09/02/2016 return t osender, unclaimed and unable to forward. I will send it to med. rec to be scanned in.

## 2017-05-28 ENCOUNTER — Encounter: Payer: Self-pay | Admitting: Obstetrics and Gynecology

## 2018-04-02 NOTE — Progress Notes (Signed)
PCP:  [name], MD   Chief Complaint  Patient presents with  . Gynecologic Exam    Left breast a little tender notice when she starts her period, wants a mammogram schedule    HPI:      [name] is a 43 y.o. [phone] who LMP was Patient's last menstrual period was 03/11/2018 (exact date)., presents today for her annual examination.  Her menses are regular every 28-30 days, lasting 2-3ddays now (used to be 5).  Dysmenorrhea minimal. Sharp pelvic pains from last yr resolved. She does not have intermenstrual bleeding. Takes xanax occas wk of menses with sx improvement (Rxd by PCP).  Sex activity: single partner, contraception - condoms. Has considered prog only options in past; husband won't have vasectomy. Occas has dyspareunia. Last Pap: May 02, 2015  Results were: no abnormalities /neg HPV DNA  Hx of STDs: none  Last mammogram: never. Didn't go last yr. There is no FH of breast cancer. There is no FH of ovarian cancer. The patient does do self-breast exams. Has noticed LT breast pain before menses. Tissue feels lumpier but no discrete masses.  Tobacco use: The patient denies current or previous tobacco use. Alcohol use: none No drug use.  Exercise: moderately active  She does get adequate calcium and Vitamin D in her diet.  Labs with PCP. HTN with PCP.  Past Medical History:  Diagnosis Date  . Anemia   . Bilateral arm numbness and tingling while sleeping    pt suspects pinched nerves in neck  . Chest pain 06/19/2013  . Crohn disease (HCC)   . Epigastric pain   . H/O blood clots   . Heart palpitations 06/19/2013  . Hemorrhoids   . Hiatal hernia   . HTN (hypertension) 06/19/2013  . Hyperlipidemia 06/19/2013  . Hypertension   . Intussusception intestine (HCC)   . Menstrual migraine 07/27/2015  . Migraines   . SBO (small bowel obstruction) (HCC) 03/17/2016  . Thrombosis, portal vein     Past Surgical History:  Procedure Laterality Date  . APPENDECTOMY    .  BOWEL RESECTION  2018  . COLPOSCOPY  12/2006  . LAPAROTOMY N/A 03/17/2016   Procedure: EXPLORATORY LAPAROTOMY,right colon resection.terminal resection,appendectomy;  Surgeon: Lattie Haw, MD;  Location: [facility] ORS;  Service: General;  Laterality: N/A;    Family History  Problem Relation Age of Onset  . Hyperlipidemia Mother   . Hypertension Mother   . Bladder Cancer Father   . Leukemia Father   . Other Father        Myocardial Infarction, old/healed    Social History   Socioeconomic History  . Marital status: Married    Spouse name: Not on file  . Number of children: Not on file  . Years of education: Not on file  . Highest education level: Not on file  Occupational History  . Not on file  Social Needs  . Financial resource strain: Not on file  . Food insecurity:    Worry: Not on file    Inability: Not on file  . Transportation needs:    Medical: Not on file    Non-medical: Not on file  Tobacco Use  . Smoking status: Never Smoker  . Smokeless tobacco: Never Used  Substance and Sexual Activity  . Alcohol use: Yes    Comment: occasional  . Drug use: No  . Sexual activity: Yes    Birth control/protection: None, Condom  Lifestyle  . Physical activity:  Days per week: Not on file    Minutes per session: Not on file  . Stress: Not on file  Relationships  . Social connections:    Talks on phone: Not on file    Gets together: Not on file    Attends religious service: Not on file    Active member of club or organization: Not on file    Attends meetings of clubs or organizations: Not on file    Relationship status: Not on file  . Intimate partner violence:    Fear of current or ex partner: Not on file    Emotionally abused: Not on file    Physically abused: Not on file    Forced sexual activity: Not on file  Other Topics Concern  . Not on file  Social History Narrative  . Not on file    Current Meds  Medication Sig  . ALPRAZolam (XANAX) 0.25 MG tablet  TAKE ONE TABLET BY MOUTH ONCE DAILY AT BEDTIME  . carvedilol (COREG) 6.25 MG tablet Take 1 tablet (6.25 mg total) by mouth 2 (two) times daily.  Marland Kitchen ibuprofen (ADVIL,MOTRIN) 200 MG tablet Take 200 mg by mouth every 6 (six) hours as needed.  Marland Kitchen lisinopril (PRINIVIL,ZESTRIL) 5 MG tablet Take 1 tablet (5 mg total) by mouth daily.  . Multiple Vitamin (MULTIVITAMIN) tablet Take 1 tablet by mouth daily.  . sertraline (ZOLOFT) 25 MG tablet Take by mouth.     ROS:  Review of Systems  Constitutional: Negative for fatigue, fever and unexpected weight change.  Respiratory: Negative for cough, shortness of breath and wheezing.   Cardiovascular: Negative for chest pain, palpitations and leg swelling.  Gastrointestinal: Negative for blood in stool, constipation, diarrhea, nausea and vomiting.  Endocrine: Negative for cold intolerance, heat intolerance and polyuria.  Genitourinary: Negative for dyspareunia, dysuria, flank pain, frequency, genital sores, hematuria, menstrual problem, pelvic pain, urgency, vaginal bleeding, vaginal discharge and vaginal pain.  Musculoskeletal: Negative for back pain, joint swelling and myalgias.  Skin: Negative for rash.  Neurological: Negative for dizziness, syncope, light-headedness, numbness and headaches.  Hematological: Negative for adenopathy.  Psychiatric/Behavioral: Negative for agitation, confusion, sleep disturbance and suicidal ideas. The patient is not nervous/anxious.   BREAST: pos for tenderness   Objective: BP 122/68   Pulse 72   Ht 5\' 3"  (1.6 m)   Wt 142 lb (64.4 kg)   LMP 03/11/2018 (Exact Date)   BMI 25.15 kg/m    Physical Exam Constitutional:      Appearance: She is well-developed.  Genitourinary:     Vulva, vagina, uterus, right adnexa and left adnexa normal.     No vulval lesion or tenderness noted.     No vaginal discharge, erythema or tenderness.     No cervical motion tenderness or polyp.     Uterus is not enlarged or tender.     No  right or left adnexal mass present.     Right adnexa not tender.     Left adnexa not tender.  Neck:     Musculoskeletal: Normal range of motion.     Thyroid: No thyromegaly.  Cardiovascular:     Rate and Rhythm: Normal rate and regular rhythm.     Heart sounds: Normal heart sounds. No murmur.  Pulmonary:     Effort: Pulmonary effort is normal.     Breath sounds: Normal breath sounds.  Chest:     Breasts:        Right: No mass, nipple discharge, skin change or  tenderness.        Left: No mass, nipple discharge, skin change or tenderness.  Abdominal:     Palpations: Abdomen is soft.     Tenderness: There is no abdominal tenderness. There is no guarding.  Musculoskeletal: Normal range of motion.  Neurological:     Mental Status: She is alert and oriented to person, place, and time.     Cranial Nerves: No cranial nerve deficit.  Psychiatric:        Behavior: Behavior normal.  Vitals signs reviewed.     Assessment/Plan: Encounter for annual routine gynecological examination  Cervical cancer screening - Plan: Cytology - PAP  Screening for HPV (human papillomavirus) - Plan: Cytology - PAP  Screening for breast cancer - Pt to sched mammo - Plan: MM 3D SCREEN BREAST BILATERAL  Breast tenderness - Neg exam. Check mammo. Most likely hormonal. D/C caffeine. Can add Vit E if sx persist   GYN counsel breast self exam, mammography screening,  adequate intake of calcium and vitamin D, diet and exercise     F/U  Return in about 1 year (around 04/04/2019).  Lesieli Bresee B. Meggen Spaziani, PA-C 04/03/2018 9:09 AM

## 2018-04-03 ENCOUNTER — Ambulatory Visit (INDEPENDENT_AMBULATORY_CARE_PROVIDER_SITE_OTHER): Payer: BLUE CROSS/BLUE SHIELD | Admitting: Obstetrics and Gynecology

## 2018-04-03 ENCOUNTER — Encounter: Payer: Self-pay | Admitting: Obstetrics and Gynecology

## 2018-04-03 ENCOUNTER — Other Ambulatory Visit (HOSPITAL_COMMUNITY)
Admission: RE | Admit: 2018-04-03 | Discharge: 2018-04-03 | Disposition: A | Discharge status: Home / Self Care | Payer: BLUE CROSS/BLUE SHIELD | Source: Ambulatory Visit | Attending: Obstetrics and Gynecology | Admitting: Obstetrics and Gynecology

## 2018-04-03 VITALS — BP 122/68 | HR 72 | Ht 63.0 in | Wt 142.0 lb

## 2018-04-03 DIAGNOSIS — Z01419 Encounter for gynecological examination (general) (routine) without abnormal findings: Secondary | ICD-10-CM

## 2018-04-03 DIAGNOSIS — Z1151 Encounter for screening for human papillomavirus (HPV): Secondary | ICD-10-CM

## 2018-04-03 DIAGNOSIS — Z1239 Encounter for other screening for malignant neoplasm of breast: Secondary | ICD-10-CM

## 2018-04-03 DIAGNOSIS — Z124 Encounter for screening for malignant neoplasm of cervix: Secondary | ICD-10-CM

## 2018-04-03 DIAGNOSIS — N644 Mastodynia: Secondary | ICD-10-CM

## 2018-04-03 NOTE — Patient Instructions (Addendum)
I value your feedback and entrusting us with your care. If you get a [facility] patient survey, I would appreciate you taking the time to let us know about your experience today. Thank you!  [facility]: [phone]    

## 2018-04-04 LAB — CYTOLOGY - PAP
Adequacy: ABSENT
Diagnosis: NEGATIVE
HPV: NOT DETECTED

## 2018-07-15 ENCOUNTER — Telehealth: Payer: Self-pay | Admitting: Physician Assistant

## 2018-07-15 NOTE — Progress Notes (Signed)
Virtual Visit via Video Note   This visit type was conducted due to national recommendations for restrictions regarding the COVID-19 Pandemic (e.g. social distancing) in an effort to limit this patient's exposure and mitigate transmission in our community.  Due to her co-morbid illnesses, this patient is at least at moderate risk for complications without adequate follow up.  This format is felt to be most appropriate for this patient at this time.  All issues noted in this document were discussed and addressed.  A limited physical exam was performed with this format.  Please refer to the patient's chart for her consent to telehealth for [facility].   Date:  07/16/2018   ID:  Chelsea Carroll, DOB 08-21-1975, MRN [id number]  Patient Location: Other:  work Provider Location: Home  PCP:  [name], MD  Cardiologist:  Lorine Bears, MD  Electrophysiologist:  None   Evaluation Performed:  Follow-Up Visit  Chief Complaint:  Palpitations   History of Present Illness:    Chelsea Carroll is a 43 y.o. female with possible Crohn's disease, iron deficiency anemia, portal vein thrombosis, HTN migraine disorder, GAD, and family history of CAD (both grandfathers, not premature) who presents for evaluation of palpitations.   She has previously been evaluated for chest pain with stress echo in 06/2013 being normal. Renal artery duplex in 10/2013 showed no evidence of RAS. With regards to her hypertension, she has been intolerant to losartan secondary to GI symptoms, while amlodipine lead to her feeling bad. She has tolerated lisinopril. She was last seen in the office in 02/2016 and noted left-sided chest pain on the morning of her visit located in the left breast, described as sharp that got worse with laying down, movement, and with deep inspiration. Her EKG did not show any evidence of pericarditis. Work up at that time showed a negative troponin, normal d-dimer, normal sed rate, potassium 3.9,  SCr 0.69, HGB 11.6, CXR not acute. Echo in 05/2016 showed an EF of 60-65%, normal wall motion, normal LV diastolic function, mild MR, left atrium normal in size, RVSF normal, PASP normal. She was subsequently admitted with SBO and intussusception s/p exploratory laparotomy with hemicolectomy with pathology showing acute enteritis complicated by ileus with portal vein thrombosis treated with anticoagulation.   She is seen in telehealth today noting a 2 week history of increased palpitations that are noticeable with exertion. They do not occur daily. Her longest lasting episode occurred on 5/30 while gardening, lasting 10 minutes with spontaneous resolution. There was associated SOB, though otherwise no associated symptoms. She most recently had an episode on 6/2 which lasted a couple minutes and spontaneously resolved. She has denied any chest pain, dizziness, presyncope, or syncope. She drinks one cup of coffee daily followed by water. She had not recently been checking her BP, though following the above increase in palpitations, she began checking this and has noted BP in the low 100s systolic following her palpitations. This morning, she is asymptomatic, at work, and noted a BP in the 120s systolic. It was also noted that she has only been taking Coreg once daily and has done so for many years. She is concerned also as she has not recently had her cholesterol checked.   Labs: 04/2018 - HGB 13.0, SCr 0.7, K+ 4.6, AST/ALT normal, sed rate 22, CRP 2, TSH 1.128, free T4 0.82  The patient does not have symptoms concerning for COVID-19 infection (fever, chills, cough, or new shortness of breath).    Past  Medical History:  Diagnosis Date   Anemia    Bilateral arm numbness and tingling while sleeping    pt suspects pinched nerves in neck   Chest pain 06/19/2013   Crohn disease (HCC)    Epigastric pain    H/O blood clots    Heart palpitations 06/19/2013   Hemorrhoids    Hiatal hernia    HTN  (hypertension) 06/19/2013   Hyperlipidemia 06/19/2013   Hypertension    Intussusception intestine (HCC)    Menstrual migraine 07/27/2015   Migraines    SBO (small bowel obstruction) (HCC) 03/17/2016   Thrombosis, portal vein    Past Surgical History:  Procedure Laterality Date   APPENDECTOMY     BOWEL RESECTION  2018   COLPOSCOPY  12/2006   LAPAROTOMY N/A 03/17/2016   Procedure: EXPLORATORY LAPAROTOMY,right colon resection.terminal resection,appendectomy;  Surgeon: Lattie Haw, MD;  Location: [facility] ORS;  Service: General;  Laterality: N/A;     Current Meds  Medication Sig   ALPRAZolam (XANAX) 0.25 MG tablet TAKE ONE TABLET BY MOUTH ONCE DAILY AT BEDTIME   carvedilol (COREG) 6.25 MG tablet Take 1 tablet (6.25 mg total) by mouth 2 (two) times daily. (Patient taking differently: Take 6.25 mg by mouth daily. )   ibuprofen (ADVIL,MOTRIN) 200 MG tablet Take 200 mg by mouth every 6 (six) hours as needed.   lisinopril (PRINIVIL,ZESTRIL) 5 MG tablet Take 1 tablet (5 mg total) by mouth daily.   Multiple Vitamin (MULTIVITAMIN) tablet Take 1 tablet by mouth daily.   sertraline (ZOLOFT) 25 MG tablet Take by mouth.     Allergies:   Iodine   Social History   Tobacco Use   Smoking status: Never Smoker   Smokeless tobacco: Never Used  Substance Use Topics   Alcohol use: Yes    Comment: occasional   Drug use: No     Family Hx: The patient's family history includes Bladder Cancer in her father; Hyperlipidemia in her mother; Hypertension in her mother; Leukemia in her father; Other in her father.  ROS:   Please see the history of present illness.     All other systems reviewed and are negative.   Prior CV studies:   The following studies were reviewed today:  2D Echo 05/2016: - Left ventricle: The cavity size was normal. Systolic function was   normal. The estimated ejection fraction was in the range of 60%   to 65%. Wall motion was normal; there were no regional  wall   motion abnormalities. Left ventricular diastolic function   parameters were normal. - Mitral valve: There was mild regurgitation. - Left atrium: The atrium was normal in size. - Right ventricle: Systolic function was normal. - Pulmonary arteries: Systolic pressure was within the normal   range.  Labs/Other Tests and Data Reviewed:    EKG:  No ECG reviewed.  Recent Labs: No results found for requested labs within last 8760 hours.   Recent Lipid Panel Lab Results  Component Value Date/Time   CHOL 266 (H) 09/16/2013 08:57 AM   TRIG 113 09/16/2013 08:57 AM   HDL 83 09/16/2013 08:57 AM   CHOLHDL 3.2 09/16/2013 08:57 AM   LDLCALC 160 (H) 09/16/2013 08:57 AM    Wt Readings from Last 3 Encounters:  07/16/18 140 lb (63.5 kg)  04/03/18 142 lb (64.4 kg)  04/02/17 135 lb (61.2 kg)     Objective:    Vital Signs:  BP 126/85    Pulse 76    Ht 5\' 3"  (  1.6 m)    Wt 140 lb (63.5 kg)    BMI 24.80 kg/m    VITAL SIGNS:  reviewed GEN:  no acute distress EYES:  sclerae anicteric, EOMI - Extraocular Movements Intact   ASSESSMENT & PLAN:    1. Palpitations: Currently asymptomatic. History appears to be consistent with possible SVT. Schedule Zio patch. Check echo, TSH, BMP, and CBC. No recent increased in caffeine intake. Following Zio results, will likely transition her from Coreg to Toprol XL for ease of dosing.   2. HTN: Blood pressure is well controlled today. Continue Coreg and lisinopril. Will likely transition to Toprol XL as above for once daily dosing.   3. HLD: Check lipid panel. Prior LDL of 160 form 2015.   4. Family history of coronary artery disease: Await the above work up. Consider coronary CTA to assist with risk stratification in follow up.    COVID-19 Education: The signs and symptoms of COVID-19 were discussed with the patient and how to seek care for testing (follow up with PCP or arrange E-visit).  The importance of social distancing was discussed  today.  Time:   Today, I have spent 18 minutes with the patient with telehealth technology discussing the above problems.     Medication Adjustments/Labs and Tests Ordered: Current medicines are reviewed at length with the patient today.  Concerns regarding medicines are outlined above.   Tests Ordered: No orders of the defined types were placed in this encounter.   Medication Changes: No orders of the defined types were placed in this encounter.   Disposition:  Follow up in 2 month(s)  Signed, Eula Listenyan Montasia Chisenhall, PA-C  07/16/2018 9:30 AM    [facility]

## 2018-07-15 NOTE — Telephone Encounter (Signed)
Virtual Visit Pre-Appointment Phone Call  "(Name), I am calling you today to discuss your upcoming appointment. We are currently trying to limit exposure to the virus that causes COVID-19 by seeing patients at home rather than in the office."  1. "What is the BEST phone number to call the day of the visit?" - include this in appointment notes  2. Do you have or have access to (through a family member/friend) a smartphone with video capability that we can use for your visit?" a. If yes - list this number in appt notes as cell (if different from BEST phone #) and list the appointment type as a VIDEO visit in appointment notes b. If no - list the appointment type as a PHONE visit in appointment notes  3. Confirm consent - "In the setting of the current Covid19 crisis, you are scheduled for a (phone or video) visit with your provider on (date) at (time).  Just as we do with many in-office visits, in order for you to participate in this visit, we must obtain consent.  If you'd like, I can send this to your mychart (if signed up) or email for you to review.  Otherwise, I can obtain your verbal consent now.  All virtual visits are billed to your insurance company just like a normal visit would be.  By agreeing to a virtual visit, we'd like you to understand that the technology does not allow for your provider to perform an examination, and thus may limit your provider's ability to fully assess your condition. If your provider identifies any concerns that need to be evaluated in person, we will make arrangements to do so.  Finally, though the technology is pretty good, we cannot assure that it will always work on either your or our end, and in the setting of a video visit, we may have to convert it to a phone-only visit.  In either situation, we cannot ensure that we have a secure connection.  Are you willing to proceed?" STAFF: Did the patient verbally acknowledge consent to telehealth visit? Document  YES/NO here: YES  4. Advise patient to be prepared - "Two hours prior to your appointment, go ahead and check your blood pressure, pulse, oxygen saturation, and your weight (if you have the equipment to check those) and write them all down. When your visit starts, your provider will ask you for this information. If you have an Apple Watch or Kardia device, please plan to have heart rate information ready on the day of your appointment. Please have a pen and paper handy nearby the day of the visit as well."  5. Give patient instructions for MyChart download to smartphone OR Doximity/Doxy.me as below if video visit (depending on what platform provider is using)  6. Inform patient they will receive a phone call 15 minutes prior to their appointment time (may be from unknown caller ID) so they should be prepared to answer    TELEPHONE CALL NOTE  Chelsea Carroll has been deemed a candidate for a follow-up tele-health visit to limit community exposure during the Covid-19 pandemic. I spoke with the patient via phone to ensure availability of phone/video source, confirm preferred email & phone number, and discuss instructions and expectations.  I reminded Chelsea Carroll to be prepared with any vital sign and/or heart rhythm information that could potentially be obtained via home monitoring, at the time of her visit. I reminded Chelsea Carroll to expect a phone call prior to  her visit.  Chelsea Carroll 07/15/2018 2:30 PM   INSTRUCTIONS FOR DOWNLOADING THE MYCHART APP TO SMARTPHONE  - The patient must first make sure to have activated MyChart and know their login information - If Apple, go to [employer] and type in MyChart in the search bar and download the app. If Android, ask patient to go to [employer] and type in [place] in the search bar and download the app. The app is free but as with any other app downloads, their phone may require them to verify saved payment information or  Apple/Android password.  - The patient will need to then log into the app with their MyChart username and password, and select [facility] as their healthcare provider to link the account. When it is time for your visit, go to the MyChart app, find appointments, and click Begin Video Visit. Be sure to Select Allow for your device to access the Microphone and Camera for your visit. You will then be connected, and your provider will be with you shortly.  **If they have any issues connecting, or need assistance please contact MyChart service desk [phone])**  **If using a computer, in order to ensure the best quality for their visit they will need to use either of the following Internet Browsers: [employer] or Google Chrome**  IF USING DOXIMITY or DOXY.ME - The patient will receive a link just prior to their visit by text.     FULL LENGTH CONSENT FOR TELE-HEALTH VISIT   I hereby voluntarily request, consent and authorize [facility] and its employed or contracted physicians, physician assistants, nurse practitioners or other licensed health care professionals (the Practitioner), to provide me with telemedicine health care services (the Services") as deemed necessary by the treating Practitioner. I acknowledge and consent to receive the Services by the Practitioner via telemedicine. I understand that the telemedicine visit will involve communicating with the Practitioner through live audiovisual communication technology and the disclosure of certain medical information by electronic transmission. I acknowledge that I have been given the opportunity to request an in-person assessment or other available alternative prior to the telemedicine visit and am voluntarily participating in the telemedicine visit.  I understand that I have the right to withhold or withdraw my consent to the use of telemedicine in the course of my care at any time, without affecting my right to future care  or treatment, and that the Practitioner or I may terminate the telemedicine visit at any time. I understand that I have the right to inspect all information obtained and/or recorded in the course of the telemedicine visit and may receive copies of available information for a reasonable fee.  I understand that some of the potential risks of receiving the Services via telemedicine include:   Delay or interruption in medical evaluation due to technological equipment failure or disruption;  Information transmitted may not be sufficient (e.g. poor resolution of images) to allow for appropriate medical decision making by the Practitioner; and/or   In rare instances, security protocols could fail, causing a breach of personal health information.  Furthermore, I acknowledge that it is my responsibility to provide information about my medical history, conditions and care that is complete and accurate to the best of my ability. I acknowledge that Practitioner's advice, recommendations, and/or decision may be based on factors not within their control, such as incomplete or inaccurate data provided by me or distortions of diagnostic images or specimens that may result from electronic transmissions. I understand  that the practice of medicine is not an exact science and that Practitioner makes no warranties or guarantees regarding treatment outcomes. I acknowledge that I will receive a copy of this consent concurrently upon execution via email to the email address I last provided but may also request a printed copy by calling the office of [facility].    I understand that my insurance will be billed for this visit.   I have read or had this consent read to me.  I understand the contents of this consent, which adequately explains the benefits and risks of the Services being provided via telemedicine.   I have been provided ample opportunity to ask questions regarding this consent and the Services and have had  my questions answered to my satisfaction.  I give my informed consent for the services to be provided through the use of telemedicine in my medical care  By participating in this telemedicine visit I agree to the above.

## 2018-07-16 ENCOUNTER — Other Ambulatory Visit: Payer: Self-pay

## 2018-07-16 ENCOUNTER — Encounter: Payer: Self-pay | Admitting: Physician Assistant

## 2018-07-16 ENCOUNTER — Telehealth (INDEPENDENT_AMBULATORY_CARE_PROVIDER_SITE_OTHER): Payer: BC Managed Care – PPO | Admitting: Physician Assistant

## 2018-07-16 VITALS — BP 126/85 | HR 76 | Ht 63.0 in | Wt 140.0 lb

## 2018-07-16 DIAGNOSIS — R002 Palpitations: Secondary | ICD-10-CM | POA: Diagnosis not present

## 2018-07-16 DIAGNOSIS — E782 Mixed hyperlipidemia: Secondary | ICD-10-CM

## 2018-07-16 DIAGNOSIS — I10 Essential (primary) hypertension: Secondary | ICD-10-CM

## 2018-07-16 DIAGNOSIS — Z8249 Family history of ischemic heart disease and other diseases of the circulatory system: Secondary | ICD-10-CM

## 2018-07-16 NOTE — Patient Instructions (Signed)
It was a pleasure to speak with you on the phone today! Thank you for allowing [place] to continue taking care of your [facility] needs during this time.   Feel free to call as needed for questions and concerns related to your cardiac needs.   Medication Instructions:  Your physician recommends that you continue on your current medications as directed. Please refer to the Current Medication list given to you today.  If you need a refill on your cardiac medications before your next appointment, please call your pharmacy.   Lab work: 1- Your physician recommends that you return for lab work in: this week at [facility] You will need to be fasting. (Labs needed: Fasting lipid, BMET, CBC, TSH) No appt is needed. Hours are M-F 7AM- 6 PM. You will need to wear mask when entering medical mall and will be asked COVID screening questions.   If you have labs (blood work) drawn today and your tests are completely normal, you will receive your results only by: Marland Kitchen MyChart Message (if you have MyChart) OR . A paper copy in the mail If you have any lab test that is abnormal or we need to change your treatment, we will call you to review the results.  Testing/Procedures: 1-  Zio XT: We will place order and you will receive it in the mail.  You may get a call from [employer] company @ either  [phone] Or  [phone] for them to confirm your address before it will be sent to you.  You will wear the monitor for 14 days, remove it and send it back to the company. They will send [place] a report. Then we will call you with the results.  2- Echo  Please return to [facility] on ______________ at _______________ AM/PM for an Echocardiogram. Your physician has requested that you have an echocardiogram. Echocardiography is a painless test that uses sound waves to create images of your heart. It provides your doctor with information about the size and shape of your heart and how well your heart's chambers  and valves are working. This procedure takes approximately one hour. There are no restrictions for this procedure. Please note; depending on visual quality an IV may need to be placed.  The front office will call you tomorrow to set up appointments.    Follow-Up: At [facility], you and your health needs are our priority.  As part of our continuing mission to provide you with exceptional heart care, we have created designated Provider Care Teams.  These Care Teams include your primary Cardiologist (physician) and Advanced Practice Providers (APPs -  Physician Assistants and Nurse Practitioners) who all work together to provide you with the care you need, when you need it. You will need a follow up appointment in 2 months.   You may see Lorine Bears, MD or Eula Listen, PA-C.

## 2018-07-18 ENCOUNTER — Other Ambulatory Visit: Payer: Self-pay | Admitting: Physician Assistant

## 2018-07-18 DIAGNOSIS — R0600 Dyspnea, unspecified: Secondary | ICD-10-CM

## 2018-08-06 ENCOUNTER — Other Ambulatory Visit: Payer: BC Managed Care – PPO

## 2018-09-11 ENCOUNTER — Other Ambulatory Visit: Payer: Self-pay

## 2018-09-11 ENCOUNTER — Telehealth: Payer: BC Managed Care – PPO | Admitting: Cardiovascular Disease

## 2018-12-04 ENCOUNTER — Telehealth: Payer: Self-pay | Admitting: Cardiovascular Disease

## 2018-12-04 NOTE — Telephone Encounter (Signed)
Returned the patient's call. She received the zio-monitor ordered by Christell Faith, PA by mail in June 2020. She never applied or activated the monitor. She No showed for her echo and f/u appt.  Patient sts that she has been under a lot of stress and has been having increased palpitations. She would like to go ahead and apply the zio. Adv her that she should be able to use the monitor. Adv her to contact zio customer service to make sure it can be used. She will call back to reschedule her f/u appt.

## 2018-12-04 NOTE — Telephone Encounter (Signed)
Patient calling Patient still has heart monitor from earlier in year that was not used Patient would like to know if she can activate it and use it Patient has been noticing more palpitations on and off and has not been feeling well Please call to discuss

## 2019-04-19 ENCOUNTER — Emergency Department: Payer: BC Managed Care – PPO

## 2019-04-19 ENCOUNTER — Encounter: Payer: Self-pay | Admitting: *Deleted

## 2019-04-19 ENCOUNTER — Other Ambulatory Visit: Payer: Self-pay

## 2019-04-19 ENCOUNTER — Emergency Department
Admission: EM | Admit: 2019-04-19 | Discharge: 2019-04-20 | Disposition: A | Discharge status: Home / Self Care | Payer: BC Managed Care – PPO | Attending: Emergency Medicine | Admitting: Emergency Medicine

## 2019-04-19 DIAGNOSIS — Z79899 Other long term (current) drug therapy: Secondary | ICD-10-CM | POA: Insufficient documentation

## 2019-04-19 DIAGNOSIS — M542 Cervicalgia: Secondary | ICD-10-CM | POA: Diagnosis not present

## 2019-04-19 DIAGNOSIS — I10 Essential (primary) hypertension: Secondary | ICD-10-CM | POA: Diagnosis not present

## 2019-04-19 DIAGNOSIS — R519 Headache, unspecified: Secondary | ICD-10-CM | POA: Diagnosis not present

## 2019-04-19 DIAGNOSIS — H53149 Visual discomfort, unspecified: Secondary | ICD-10-CM | POA: Diagnosis not present

## 2019-04-19 LAB — COMPREHENSIVE METABOLIC PANEL
ALT: 28 U/L (ref 0–44)
AST: 28 U/L (ref 15–41)
Albumin: 4 g/dL (ref 3.5–5.0)
Alkaline Phosphatase: 63 U/L (ref 38–126)
Anion gap: 12 (ref 5–15)
BUN: 15 mg/dL (ref 6–20)
CO2: 26 mmol/L (ref 22–32)
Calcium: 9.3 mg/dL (ref 8.9–10.3)
Chloride: 100 mmol/L (ref 98–111)
Creatinine, Ser: 0.7 mg/dL (ref 0.44–1.00)
GFR calc Af Amer: >60 mL/min (ref >60)
GFR calc non Af Amer: >60 mL/min (ref >60)
Glucose, Bld: 94 mg/dL (ref 70–99)
Potassium: 3.8 mmol/L (ref 3.5–5.1)
Sodium: 138 mmol/L (ref 135–145)
Total Bilirubin: 1.1 mg/dL (ref 0.3–1.2)
Total Protein: 7.6 g/dL (ref 6.5–8.1)

## 2019-04-19 LAB — CBC WITH DIFFERENTIAL/PLATELET
Abs Immature Granulocytes: 0.02 10*3/uL (ref 0.00–0.07)
Basophils Absolute: 0 10*3/uL (ref 0.0–0.1)
Basophils Relative: 1 %
Eosinophils Absolute: 0.1 10*3/uL (ref 0.0–0.5)
Eosinophils Relative: 2 %
HCT: 38.3 % (ref 36.0–46.0)
Hemoglobin: 12.4 g/dL (ref 12.0–15.0)
Immature Granulocytes: 0 %
Lymphocytes Relative: 36 %
Lymphs Abs: 1.8 10*3/uL (ref 0.7–4.0)
MCH: 28.9 pg (ref 26.0–34.0)
MCHC: 32.4 g/dL (ref 30.0–36.0)
MCV: 89.3 fL (ref 80.0–100.0)
Monocytes Absolute: 0.5 10*3/uL (ref 0.1–1.0)
Monocytes Relative: 9 %
Neutro Abs: 2.6 10*3/uL (ref 1.7–7.7)
Neutrophils Relative %: 52 %
Platelets: 279 10*3/uL (ref 150–400)
RBC: 4.29 MIL/uL (ref 3.87–5.11)
RDW: 14.2 % (ref 11.5–15.5)
WBC: 5 10*3/uL (ref 4.0–10.5)
nRBC: 0 % (ref 0.0–0.2)

## 2019-04-19 MED ORDER — FENTANYL CITRATE (PF) 100 MCG/2ML IJ SOLN
50.0000 ug | Freq: Once | INTRAMUSCULAR | Status: AC
  Administered 2019-04-19: 50 ug via INTRAVENOUS
  Filled 2019-04-19: qty 2

## 2019-04-19 MED ORDER — DIPHENHYDRAMINE HCL 50 MG/ML IJ SOLN
50.0000 mg | Freq: Once | INTRAMUSCULAR | Status: DC
  Filled 2019-04-19: qty 1

## 2019-04-19 MED ORDER — DIPHENHYDRAMINE HCL 50 MG/ML IJ SOLN
50.0000 mg | Freq: Once | INTRAMUSCULAR | Status: AC
  Administered 2019-04-19: 50 mg via INTRAVENOUS
  Filled 2019-04-19: qty 1

## 2019-04-19 MED ORDER — DIPHENHYDRAMINE HCL 50 MG/ML IJ SOLN
25.0000 mg | Freq: Once | INTRAMUSCULAR | Status: AC
  Administered 2019-04-19: 17:00:00 25 mg via INTRAVENOUS

## 2019-04-19 MED ORDER — METHYLPREDNISOLONE SODIUM SUCC 125 MG IJ SOLR
80.0000 mg | Freq: Once | INTRAMUSCULAR | Status: DC
  Filled 2019-04-19: qty 2

## 2019-04-19 MED ORDER — SODIUM CHLORIDE 0.9 % IV BOLUS
1000.0000 mL | Freq: Once | INTRAVENOUS | Status: AC
  Administered 2019-04-19: 1000 mL via INTRAVENOUS

## 2019-04-19 MED ORDER — DIPHENHYDRAMINE HCL 50 MG/ML IJ SOLN: 50.0000 mg | Freq: Once | INTRAMUSCULAR | Status: DC

## 2019-04-19 MED ORDER — MAGNESIUM SULFATE 2 GM/50ML IV SOLN
2.0000 g | INTRAVENOUS | Status: AC
Start: 2019-04-19 — End: 2019-04-20
  Administered 2019-04-19: 2 g via INTRAVENOUS
  Filled 2019-04-19: qty 50

## 2019-04-19 MED ORDER — KETOROLAC TROMETHAMINE 30 MG/ML IJ SOLN
15.0000 mg | INTRAMUSCULAR | Status: AC
  Administered 2019-04-19: 15 mg via INTRAVENOUS
  Filled 2019-04-19: qty 1

## 2019-04-19 MED ORDER — METOCLOPRAMIDE HCL 5 MG/ML IJ SOLN
10.0000 mg | Freq: Once | INTRAMUSCULAR | Status: AC
  Administered 2019-04-19: 10 mg via INTRAVENOUS
  Filled 2019-04-19: qty 2

## 2019-04-19 MED ORDER — METHYLPREDNISOLONE SODIUM SUCC 125 MG IJ SOLR
125.0000 mg | Freq: Once | INTRAMUSCULAR | Status: AC
  Administered 2019-04-19: 125 mg via INTRAVENOUS

## 2019-04-19 MED ORDER — IOHEXOL 350 MG/ML SOLN
75.0000 mL | Freq: Once | INTRAVENOUS | Status: AC | PRN
  Administered 2019-04-19: 75 mL via INTRAVENOUS

## 2019-04-19 NOTE — ED Notes (Signed)
Pt given premeds for CT. IV team notified of need for 20 g iv above wrist for CT angio as pt is difficult stick.

## 2019-04-19 NOTE — ED Notes (Signed)
Spoke with CT, ok to give next dose of benadryl and CT will scan pt in an hour.

## 2019-04-19 NOTE — ED Provider Notes (Signed)
[facility] Emergency Department Provider Note  ____________________________________________  Time seen: Approximately 11:17 PM  I have reviewed the triage vital signs and the nursing notes.   HISTORY  Chief Complaint Neck Pain and Headache    HPI Chelsea Carroll is a 44 y.o. female with a history of Crohn's disease, hypertension, small bowel obstruction, portal vein thrombosis who comes to the ED complaining of headache and neck pain past 2 weeks, gradual onset, constant, worse with neck flexion, no alleviating factors. Feels worse in the right neck, radiates up into the back..  Denies vision change, vomiting, focal weakness or paresthesias.  No trauma.  Denies fevers or chills.  No recent illness.  Does have photophobia.   She does have a history of migraine headaches but feels that this is different from usual headache syndrome.     Past Medical History:  Diagnosis Date  . Anemia   . Bilateral arm numbness and tingling while sleeping    pt suspects pinched nerves in neck  . Chest pain 06/19/2013  . Crohn disease (HCC)   . Epigastric pain   . H/O blood clots   . Heart palpitations 06/19/2013  . Hemorrhoids   . Hiatal hernia   . HTN (hypertension) 06/19/2013  . Hyperlipidemia 06/19/2013  . Hypertension   . Intussusception intestine (HCC)   . Menstrual migraine 07/27/2015  . Migraines   . SBO (small bowel obstruction) (HCC) 03/17/2016  . Thrombosis, portal vein      Patient Active Problem List   Diagnosis Date Noted  . PMDD (premenstrual dysphoric disorder) 04/02/2017  . Iron deficiency anemia 04/18/2016  . Thrombosis, portal vein   . SBO (small bowel obstruction) (HCC) 03/17/2016  . Epigastric pain   . Intussusception intestine (HCC)   . Menstrual migraine 07/27/2015  . Heart palpitations 06/19/2013  . Chest pain 06/19/2013  . HTN (hypertension) 06/19/2013  . Hyperlipidemia 06/19/2013     Past Surgical History:  Procedure Laterality Date  .  APPENDECTOMY    . BOWEL RESECTION  2018  . COLPOSCOPY  12/2006  . LAPAROTOMY N/A 03/17/2016   Procedure: EXPLORATORY LAPAROTOMY,right colon resection.terminal resection,appendectomy;  Surgeon: Lattie Haw, MD;  Location: [facility] ORS;  Service: General;  Laterality: N/A;     Prior to Admission medications   Medication Sig Start Date End Date Taking? Authorizing Provider  ALPRAZolam (XANAX) 0.25 MG tablet TAKE ONE TABLET BY MOUTH ONCE DAILY AT BEDTIME 06/15/13   [provider]  carvedilol (COREG) 6.25 MG tablet Take 1 tablet (6.25 mg total) by mouth 2 (two) times daily. Patient taking differently: Take 6.25 mg by mouth daily.  05/17/16   Iran Ouch, MD  ibuprofen (ADVIL,MOTRIN) 200 MG tablet Take 200 mg by mouth every 6 (six) hours as needed.    [provider]  lisinopril (PRINIVIL,ZESTRIL) 5 MG tablet Take 1 tablet (5 mg total) by mouth daily. 05/17/16   Iran Ouch, MD  Multiple Vitamin (MULTIVITAMIN) tablet Take 1 tablet by mouth daily.    [provider]  sertraline (ZOLOFT) 25 MG tablet Take by mouth.    [provider]     Allergies Iodine   Family History  Problem Relation Age of Onset  . Hyperlipidemia Mother   . Hypertension Mother   . Bladder Cancer Father   . Leukemia Father   . Other Father        Myocardial Infarction, old/healed    Social History Social History   Tobacco Use  .  Smoking status: Never Smoker  . Smokeless tobacco: Never Used  Substance Use Topics  . Alcohol use: Yes    Comment: occasional  . Drug use: No    Review of Systems  Constitutional:   No fever or chills.  ENT:   No sore throat. No rhinorrhea. Cardiovascular:   No chest pain or syncope. Respiratory:   No dyspnea or cough. Gastrointestinal:   Negative for abdominal pain, vomiting and diarrhea.  Musculoskeletal:   Negative for focal pain or swelling All other systems reviewed and are negative except as documented above in ROS and  HPI.  ____________________________________________   PHYSICAL EXAM:  VITAL SIGNS: ED Triage Vitals  Enc Vitals Group     BP 04/19/19 1241 (!) 140/100     Pulse Rate 04/19/19 1241 74     Resp 04/19/19 1241 18     Temp 04/19/19 1241 97.9 F (36.6 C)     Temp Source 04/19/19 1241 Oral     SpO2 04/19/19 1241 98 %     Weight 04/19/19 1243 140 lb (63.5 kg)     Height 04/19/19 1243 5\' 3"  (1.6 m)     Head Circumference --      Peak Flow --      Pain Score 04/19/19 1243 10     Pain Loc --      Pain Edu? --      Excl. in [place]? --     Vital signs reviewed, nursing assessments reviewed.   Constitutional:   Alert and oriented. Non-toxic appearance. Eyes:   Conjunctivae are normal. EOMI. PERRL. ENT      Head:   Normocephalic and atraumatic.      Nose:   Wearing a mask.      Mouth/Throat:   Wearing a mask.      Neck:   No meningismus. Full ROM.  Focal tenderness at the right neck below the skull base.  No midline spinal tenderness.  No jolt accentuation Hematological/Lymphatic/Immunilogical:   No cervical lymphadenopathy. Cardiovascular:   RRR. Symmetric bilateral radial and DP pulses.  No murmurs. Cap refill less than 2 seconds. Respiratory:   Normal respiratory effort without tachypnea/retractions. Breath sounds are clear and equal bilaterally. No wheezes/rales/rhonchi. Gastrointestinal:   Soft and nontender. Non distended. There is no CVA tenderness.  No rebound, rigidity, or guarding.  Musculoskeletal:   Normal range of motion in all extremities. No joint effusions.  No lower extremity tenderness.  No edema. Neurologic:   Normal speech and language.  Motor grossly intact. No acute focal neurologic deficits are appreciated.  Skin:    Skin is warm, dry and intact. No rash noted.  No petechiae, purpura, or bullae.  ____________________________________________    LABS (pertinent positives/negatives) (all labs ordered are listed, but only abnormal results are displayed) Labs  Reviewed  CBC WITH DIFFERENTIAL/PLATELET  COMPREHENSIVE METABOLIC PANEL   ____________________________________________   EKG    ____________________________________________    RADIOLOGY  CT Angio Head W or Wo Contrast  Result Date: 04/19/2019 CLINICAL DATA:  Evaluate vertebral dissection, venous sinus thrombosis; headache, intracranial hemorrhage suspected. Additional history provided: Patient reports neck pain and headache for 1.5 weeks EXAM: CT ANGIOGRAPHY HEAD AND NECK TECHNIQUE: Multidetector CT imaging of the head and neck was performed using the standard protocol during bolus administration of intravenous contrast. Multiplanar CT image reconstructions and MIPs were obtained to evaluate the vascular anatomy. Carotid stenosis measurements (when applicable) are obtained utilizing NASCET criteria, using the distal internal carotid diameter as the denominator.  CONTRAST:  70mL OMNIPAQUE IOHEXOL 350 MG/ML SOLN COMPARISON:  Head CT 07/28/2015, brain MRI 08/18/2007 FINDINGS: CT HEAD FINDINGS Brain: There is no evidence of acute intracranial hemorrhage, intracranial mass, midline shift or extra-axial fluid collection.No demarcated cortical infarction. Cerebral volume is normal for age. Vascular: Reported separately. Skull: Normal. Negative for fracture or focal lesion. Sinuses: Mild ethmoid sinus mucosal thickening. Small amount of frothy secretions within left ethmoid air cells. No significant mastoid effusion. Debris within the left external auditory canal. Orbits: No acute abnormality. Review of the MIP images confirms the above findings CTA NECK FINDINGS Aortic arch: Standard aortic branching. The visualized portions of the aortic arch are unremarkable. There is no innominate or proximal subclavian artery stenosis. Right carotid system: CCA and ICA smooth and patent within the neck without stenosis. Left carotid system: CCA and ICA smooth and patent within the neck without stenosis. Vertebral  arteries: The vertebral arteries are codominant and patent throughout the neck without significant stenosis. No evidence of dissection. Skeleton: No acute bony abnormality or aggressive osseous lesion. C6-C7 posterior disc osteophyte complex with associated uncinate hypertrophy. Resultant mild spinal canal stenosis and left greater than right neural foraminal narrowing at this level. Partially imaged thoracic scoliosis Other neck: No neck mass or cervical lymphadenopathy. Upper chest: No consolidation within the imaged lung apices. Review of the MIP images confirms the above findings CTA HEAD FINDINGS Anterior circulation: The intracranial internal carotid arteries are patent without significant stenosis. The M1 middle cerebral arteries are patent without significant stenosis. No M2 proximal branch occlusion or high-grade proximal stenosis is identified. The anterior cerebral arteries are patent without significant proximal stenosis. No intracranial aneurysm is identified. Posterior circulation: The intracranial vertebral arteries are patent without significant stenosis, as is the basilar artery. The bilateral posterior cerebral arteries are patent without significant proximal stenosis. A small left posterior communicating artery is present. A right posterior communicating artery is not definitively identified. Venous sinuses: Within limitations of contrast timing, no convincing thrombus. Anatomic variants: As described. Review of the MIP images confirms the above findings IMPRESSION: CT head: 1. Normal non-contrast CT appearance of the brain for age. No evidence of acute intracranial abnormality. 2. Mild ethmoid sinus mucosal thickening. Small amount of frothy secretions within left ethmoid air cells. Correlate for acute sinusitis. CTA neck: 1. The bilateral common carotid, internal carotid and vertebral arteries are patent within the neck without stenosis. No evidence of dissection. 2. C6-C7 spondylosis as  described. 3. Partially imaged thoracic scoliosis. CTA head: Unremarkable CTA of the head. No intracranial large vessel occlusion or proximal high-grade arterial stenosis. Electronically Signed   By: Jackey Loge DO   On: 04/19/2019 21:38   CT Angio Neck W and/or Wo Contrast  Result Date: 04/19/2019 CLINICAL DATA:  Evaluate vertebral dissection; headache, sudden, carotid/vertebral dissection suspected. Additional history provided: Neck pain. EXAM: CT ANGIOGRAPHY NECK TECHNIQUE: Multidetector CT imaging of the neck was performed using the standard protocol during bolus administration of intravenous contrast. Multiplanar CT image reconstructions and MIPs were obtained to evaluate the vascular anatomy. Carotid stenosis measurements (when applicable) are obtained utilizing NASCET criteria, using the distal internal carotid diameter as the denominator. CONTRAST:  62mL OMNIPAQUE IOHEXOL 350 MG/ML SOLN COMPARISON:  Head CT 07/28/2015, brain MRI 08/18/2007 FINDINGS: Please refer to the full CTA head/neck report under the CTA head examination. IMPRESSION: Please refer to the full CTA head/neck report under the CTA head examination. Electronically Signed   By: Jackey Loge DO   On: 04/19/2019 21:48  CT VENOGRAM HEAD  Result Date: 04/19/2019 CLINICAL DATA:  Dural venous sinus thrombosis suspected. EXAM: CT VENOGRAM HEAD TECHNIQUE: Postcontrast CT venography of the head was performed. Coronal and sagittal reconstructions were also submitted for evaluation. Axial, coronal and sagittal MIP reconstructions were also submitted for evaluation. CONTRAST:  71mL OMNIPAQUE IOHEXOL 350 MG/ML SOLN COMPARISON:  Head CT 07/28/2015, brain MRI 08/18/2007 FINDINGS: The superior sagittal sinus, internal cerebral veins, vein of Galen, straight sinus, transverse sinuses, sigmoid sinuses and visualized jugular veins are patent. No evidence of venous thrombosis. IMPRESSION: No evidence of venous thrombosis. Electronically Signed   By: Jackey Loge DO   On: 04/19/2019 21:47    ____________________________________________   PROCEDURES Procedures  ____________________________________________  DIFFERENTIAL DIAGNOSIS   Vertebral dissection, dural venous sinus thrombosis, atypical migraine.  CLINICAL IMPRESSION / ASSESSMENT AND PLAN / ED COURSE  Medications ordered in the ED: Medications  ketorolac (TORADOL) 30 MG/ML injection 15 mg (has no administration in time range)  magnesium sulfate IVPB 2 g 50 mL (has no administration in time range)  sodium chloride 0.9 % bolus 1,000 mL (has no administration in time range)  sodium chloride 0.9 % bolus 1,000 mL (0 mLs Intravenous Stopped 04/19/19 1953)  metoCLOPramide (REGLAN) injection 10 mg (10 mg Intravenous Given 04/19/19 1634)  methylPREDNISolone sodium succinate (SOLU-MEDROL) 125 mg/2 mL injection 125 mg (125 mg Intravenous Given 04/19/19 1631)  diphenhydrAMINE (BENADRYL) injection 25 mg (25 mg Intravenous Given 04/19/19 1643)  diphenhydrAMINE (BENADRYL) injection 50 mg (50 mg Intravenous Given 04/19/19 1951)  fentaNYL (SUBLIMAZE) injection 50 mcg (50 mcg Intravenous Given 04/19/19 1808)  iohexol (OMNIPAQUE) 350 MG/ML injection 75 mL (75 mLs Intravenous Contrast Given 04/19/19 2052)    Pertinent labs & imaging results that were available during my care of the patient were reviewed by me and considered in my medical decision making (see chart for details).  Chelsea Carroll was evaluated in Emergency Department on 04/19/2019 for the symptoms described in the history of present illness. She was evaluated in the context of the global COVID-19 pandemic, which necessitated consideration that the patient might be at risk for infection with the SARS-CoV-2 virus that causes COVID-19. Institutional protocols and algorithms that pertain to the evaluation of patients at risk for COVID-19 are in a state of rapid change based on information released by regulatory bodies including the CDC and federal and  state organizations. These policies and algorithms were followed during the patient's care in the ED.     Clinical Course as of Apr 19 2318  Wynelle Link Apr 19, 2019  1612 Patient with complicated medical history presents with right neck pain and headache.  Vital signs are normal, labs including CBC and differential are normal.  She is not immunosuppressed.  Doubt meningitis or encephalitis.  Suspicion for atypical migraine versus vertebral dissection versus venous sinus thrombosis.  Will hydrate and give Reglan and Benadryl for possible migraine symptoms while obtaining CT angiogram of the head and neck.   [PS]  1630 D/w pt and rads regarding best study to order, h/o contrast allergy. Pt reports contrast allergy was remote, when she was a child, limited to hives. She tolerated 2 contrast enhanced scans in 2018 after pretreatment.   [PS]  2316 CT scans unremarkable.  Vital signs remain normal.  Still no meningismus.  I will give IV Toradol and magnesium for further headache control, plan to discharge home.   [PS]    Clinical Course User Index [PS] Sharman Cheek, MD  ____________________________________________   FINAL CLINICAL IMPRESSION(S) / ED DIAGNOSES    Final diagnoses:  Bad headache     ED Discharge Orders    None      Portions of this note were generated with dragon dictation software. Dictation errors may occur despite best attempts at proofreading.   Sharman Cheek, MD 04/19/19 2321

## 2019-04-19 NOTE — ED Notes (Signed)
Patient is in CT at this time

## 2019-04-19 NOTE — ED Notes (Signed)
Patient returned from CT

## 2019-04-19 NOTE — ED Notes (Signed)
Pt with head and neck pain for a few weeks but worsened today. Pt states it is mostly in her neck but radiates to head

## 2019-04-19 NOTE — ED Notes (Signed)
Pt moved to private room, lights turned low for comfort.

## 2019-04-19 NOTE — Discharge Instructions (Addendum)
Your labs and CT scans were all normal today.  Please follow up with your doctor this week for continued monitoring of your symptoms.  Be sure to stay well-hydrated and get plenty of sleep.

## 2019-04-19 NOTE — ED Triage Notes (Signed)
Patient c/o neck pain and headache for 1.5 weeks. Patient saw her PMD last week, who did chest x-rays and started her on Doxycycline. Patient states pain has progressed and now cannot move head without having severe pain. Patient states this doesn't feel like her migraine.

## 2019-04-19 NOTE — ED Notes (Signed)
Iv attempt x 2, without success, Victorino Dike RN asked to attempt IV.

## 2019-05-12 ENCOUNTER — Encounter: Payer: Self-pay | Admitting: Obstetrics and Gynecology

## 2019-05-12 ENCOUNTER — Ambulatory Visit (INDEPENDENT_AMBULATORY_CARE_PROVIDER_SITE_OTHER): Payer: BC Managed Care – PPO | Admitting: Obstetrics and Gynecology

## 2019-05-12 ENCOUNTER — Other Ambulatory Visit: Payer: Self-pay

## 2019-05-12 VITALS — BP 118/90 | Ht 63.0 in | Wt 143.0 lb

## 2019-05-12 DIAGNOSIS — F3281 Premenstrual dysphoric disorder: Secondary | ICD-10-CM

## 2019-05-12 DIAGNOSIS — N644 Mastodynia: Secondary | ICD-10-CM

## 2019-05-12 DIAGNOSIS — N6342 Unspecified lump in left breast, subareolar: Secondary | ICD-10-CM | POA: Diagnosis not present

## 2019-05-12 DIAGNOSIS — Z1231 Encounter for screening mammogram for malignant neoplasm of breast: Secondary | ICD-10-CM

## 2019-05-12 DIAGNOSIS — Z01419 Encounter for gynecological examination (general) (routine) without abnormal findings: Secondary | ICD-10-CM

## 2019-05-12 DIAGNOSIS — Z01411 Encounter for gynecological examination (general) (routine) with abnormal findings: Secondary | ICD-10-CM

## 2019-05-12 DIAGNOSIS — N63 Unspecified lump in unspecified breast: Secondary | ICD-10-CM

## 2019-05-12 NOTE — Progress Notes (Signed)
PCP:  [name], MD   Chief Complaint  Patient presents with  . Gynecologic Exam  . Breast exam    LB, sore     HPI:      [name] is a 44 y.o. [phone] who LMP was Patient's last menstrual period was 05/07/2019 (approximate)., presents today for her annual examination.  Her menses are regular every 28-30 days, lasting 2 days, very light (used to be 5).  Dysmenorrhea minimal. She does not have intermenstrual bleeding. Sx a few days before menses are worse than the bleeding. Has migraines, nausea, body aches and neck pain (flare of cervical disc sx), and mood changes. Has to miss work due to [employer]. Takes xanax occas wk of menses that makes her feel worse. Zoloft helps with moods for about a wk then makes her nauseated.   Sex activity: single partner, contraception - condoms. Has considered prog only options in past; husband won't have vasectomy.  Last Pap: 04/03/18  Results were: no abnormalities /neg HPV DNA  Hx of STDs: none  Last mammogram: not recent; baseline in past.  There is no FH of breast cancer. There is no FH of ovarian cancer. The patient does do self-breast exams. Has noticed LT breast/sternum tenderness with palpation for a few months. Noted a LT breast mass near nipple, more noticeable in sitting position, a couple days ago. Overall, feels like LT breast is bigger/fuller and now has visible veins. Hx of LT breast pain before menses. Very worried about breast.   Tobacco use: The patient denies current or previous tobacco use. Alcohol use: none No drug use.  Exercise: moderately active  She does get adequate calcium but not Vitamin D in her diet.  Labs with PCP. HTN with PCP.  Past Medical History:  Diagnosis Date  . Anemia   . Bilateral arm numbness and tingling while sleeping    pt suspects pinched nerves in neck  . Chest pain 06/19/2013  . Crohn disease (HCC)   . Epigastric pain   . H/O blood clots   . Heart palpitations 06/19/2013  . Hemorrhoids     . Hiatal hernia   . HTN (hypertension) 06/19/2013  . Hyperlipidemia 06/19/2013  . Hypertension   . Intussusception intestine (HCC)   . Menstrual migraine 07/27/2015  . Migraines   . SBO (small bowel obstruction) (HCC) 03/17/2016  . Thrombosis, portal vein     Past Surgical History:  Procedure Laterality Date  . APPENDECTOMY    . BOWEL RESECTION  2018  . COLPOSCOPY  12/2006  . LAPAROTOMY N/A 03/17/2016   Procedure: EXPLORATORY LAPAROTOMY,right colon resection.terminal resection,appendectomy;  Surgeon: Lattie Haw, MD;  Location: [facility] ORS;  Service: General;  Laterality: N/A;    Family History  Problem Relation Age of Onset  . Hyperlipidemia Mother   . Hypertension Mother   . Bladder Cancer Father   . Leukemia Father   . Other Father        Myocardial Infarction, old/healed    Social History   Socioeconomic History  . Marital status: Married    Spouse name: Not on file  . Number of children: Not on file  . Years of education: Not on file  . Highest education level: Not on file  Occupational History  . Not on file  Tobacco Use  . Smoking status: Never Smoker  . Smokeless tobacco: Never Used  Substance and Sexual Activity  . Alcohol use: Yes    Comment: occasional  . Drug  use: No  . Sexual activity: Yes    Birth control/protection: None, Condom  Other Topics Concern  . Not on file  Social History Narrative  . Not on file   Social Determinants of Health   Financial Resource Strain:   . Difficulty of Paying Living Expenses:   Food Insecurity:   . Worried About Charity fundraiser in the Last Year:   . Arboriculturist in the Last Year:   Transportation Needs:   . Film/video editor (Medical):   Marland Kitchen Lack of Transportation (Non-Medical):   Physical Activity:   . Days of Exercise per Week:   . Minutes of Exercise per Session:   Stress:   . Feeling of Stress :   Social Connections:   . Frequency of Communication with Friends and Family:   . Frequency of  Social Gatherings with Friends and Family:   . Attends Religious Services:   . Active Member of Clubs or Organizations:   . Attends Archivist Meetings:   Marland Kitchen Marital Status:   Intimate Partner Violence:   . Fear of Current or Ex-Partner:   . Emotionally Abused:   Marland Kitchen Physically Abused:   . Sexually Abused:     Current Meds  Medication Sig  . ALPRAZolam (XANAX) 0.25 MG tablet TAKE ONE TABLET BY MOUTH ONCE DAILY AT BEDTIME  . carvedilol (COREG) 6.25 MG tablet Take 1 tablet (6.25 mg total) by mouth 2 (two) times daily. (Patient taking differently: Take 6.25 mg by mouth daily. )  . ibuprofen (ADVIL,MOTRIN) 200 MG tablet Take 200 mg by mouth every 6 (six) hours as needed.  Marland Kitchen lisinopril (PRINIVIL,ZESTRIL) 5 MG tablet Take 1 tablet (5 mg total) by mouth daily.  . Multiple Vitamin (MULTIVITAMIN) tablet Take 1 tablet by mouth daily.  . sertraline (ZOLOFT) 25 MG tablet Take by mouth.     ROS:  Review of Systems  Constitutional: Negative for fatigue, fever and unexpected weight change.  Respiratory: Negative for cough, shortness of breath and wheezing.   Cardiovascular: Negative for chest pain, palpitations and leg swelling.  Gastrointestinal: Negative for blood in stool, constipation, diarrhea, nausea and vomiting.  Endocrine: Negative for cold intolerance, heat intolerance and polyuria.  Genitourinary: Negative for dyspareunia, dysuria, flank pain, frequency, genital sores, hematuria, menstrual problem, pelvic pain, urgency, vaginal bleeding, vaginal discharge and vaginal pain.  Musculoskeletal: Negative for back pain, joint swelling and myalgias.  Skin: Negative for rash.  Neurological: Negative for dizziness, syncope, light-headedness, numbness and headaches.  Hematological: Negative for adenopathy.  Psychiatric/Behavioral: Positive for agitation. Negative for confusion, sleep disturbance and suicidal ideas. The patient is not nervous/anxious.   BREAST: pos for  tenderness/mass   Objective: BP 118/90   Ht 5\' 3"  (1.6 m)   Wt 143 lb (64.9 kg)   LMP 05/07/2019 (Approximate)   BMI 25.33 kg/m    Physical Exam Constitutional:      Appearance: She is well-developed.  Genitourinary:     Vulva, vagina, cervix, uterus, right adnexa and left adnexa normal.     No vulval lesion or tenderness noted.     No vaginal discharge, erythema or tenderness.     No cervical polyp.     Uterus is not enlarged or tender.     No right or left adnexal mass present.     Right adnexa not tender.     Left adnexa not tender.  Neck:     Thyroid: No thyromegaly.  Cardiovascular:     Rate  and Rhythm: Normal rate and regular rhythm.     Heart sounds: Normal heart sounds. No murmur.  Pulmonary:     Effort: Pulmonary effort is normal.     Breath sounds: Normal breath sounds.  Chest:     Breasts:        Right: No mass, nipple discharge, skin change or tenderness.        Left: Tenderness present. No mass, nipple discharge or skin change.    Abdominal:     Palpations: Abdomen is soft.     Tenderness: There is no abdominal tenderness. There is no guarding.  Musculoskeletal:        General: Normal range of motion.     Cervical back: Normal range of motion.  Neurological:     Mental Status: She is alert and oriented to person, place, and time.     Cranial Nerves: No cranial nerve deficit.  Psychiatric:        Behavior: Behavior normal.  Vitals reviewed.     Assessment/Plan: Encounter for annual routine gynecological examination  Encounter for screening mammogram for malignant neoplasm of breast - Plan: MM DIAG BREAST TOMO BILATERAL, US BREAST LTD UNI LEFT INC AXILLA, US BREAST LTD UNI RIGHT INC AXILLA, Check dx mammo/u/s due to pain/pt felt mass. Will f/u with results.   Breast pain, left - Plan: MM DIAG BREAST TOMO BILATERAL, US BREAST LTD UNI LEFT INC AXILLA, US BREAST LTD UNI RIGHT INC AXILLA  Breast mass - Plan: MM DIAG BREAST TOMO BILATERAL, US BREAST  LTD UNI LEFT INC AXILLA, US BREAST LTD UNI RIGHT INC AXILLA  PMDD (premenstrual dysphoric disorder)--full body sx before menses. Discussed stopping cycles with depo/nexplanon. Pt to consider and f/u prn.    GYN counsel breast self exam, mammography screening,  adequate intake of calcium and vitamin D, diet and exercise     F/U  Return in about 1 year (around 05/11/2020).  Lavoy Bernards B. Edlin Ford, PA-C 05/12/2019 5:09 PM

## 2019-05-12 NOTE — Patient Instructions (Signed)
I value your feedback and entrusting us with your care. If you get a [facility] patient survey, I would appreciate you taking the time to let us know about your experience today. Thank you!  As of January 22, 2019, your lab results will be released to your MyChart immediately, before I even have a chance to see them. Please give me time to review them and contact you if there are any abnormalities. Thank you for your patience.   [facility]: [phone]  [facility]: [phone]  

## 2019-05-22 ENCOUNTER — Other Ambulatory Visit: Payer: BC Managed Care – PPO

## 2019-05-22 ENCOUNTER — Ambulatory Visit: Payer: BC Managed Care – PPO

## 2019-06-02 ENCOUNTER — Ambulatory Visit
Admission: RE | Admit: 2019-06-02 | Discharge: 2019-06-02 | Disposition: A | Discharge status: Home / Self Care | Payer: BC Managed Care – PPO | Source: Ambulatory Visit | Attending: Obstetrics and Gynecology | Admitting: Obstetrics and Gynecology

## 2019-06-02 DIAGNOSIS — N644 Mastodynia: Secondary | ICD-10-CM

## 2019-06-02 DIAGNOSIS — Z1231 Encounter for screening mammogram for malignant neoplasm of breast: Secondary | ICD-10-CM | POA: Insufficient documentation

## 2019-06-02 DIAGNOSIS — N63 Unspecified lump in unspecified breast: Secondary | ICD-10-CM

## 2019-06-10 ENCOUNTER — Other Ambulatory Visit: Payer: Self-pay | Admitting: Family Medicine

## 2019-06-10 DIAGNOSIS — M5030 Other cervical disc degeneration, unspecified cervical region: Secondary | ICD-10-CM

## 2019-06-10 DIAGNOSIS — M5412 Radiculopathy, cervical region: Secondary | ICD-10-CM

## 2019-07-09 ENCOUNTER — Other Ambulatory Visit: Payer: Self-pay | Admitting: Family Medicine

## 2019-07-10 ENCOUNTER — Other Ambulatory Visit: Payer: BC Managed Care – PPO

## 2020-05-27 IMAGING — US US BREAST*L* LIMITED INC AXILLA
1 series · 2 of 2 positions shown · non-contrast
Comparison: 05/02/2015

CLINICAL DATA: Patient presents with a palpable lump, reported by
her husband, in the superior retroareolar left breast. She also
reports pain along the medial aspect of the left breast.

EXAM:
DIGITAL DIAGNOSTIC BILATERAL MAMMOGRAM WITH CAD AND TOMO
ULTRASOUND BILATERAL BREAST

[Series 1: us breast*left* limited inc axilla · 0.07mm/px · 2 of 2 slices shown]
[im 1/2]
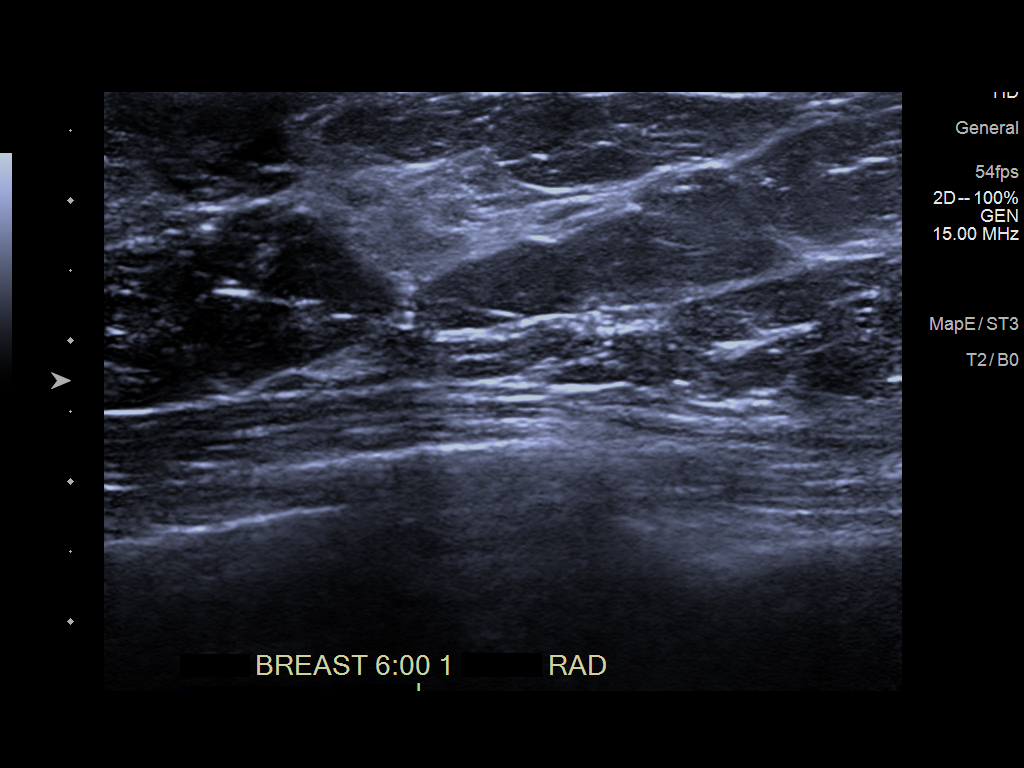
[im 2/2]
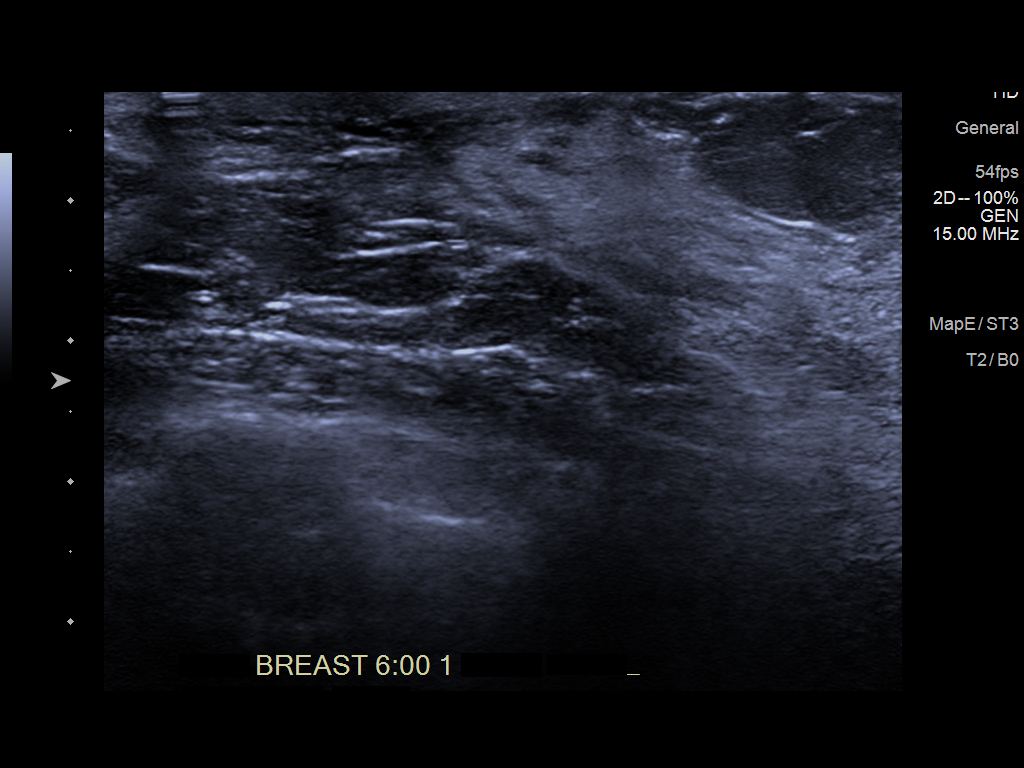

[2 of 2 positions shown; findings below may reference images not displayed]

ACR Breast Density Category c: The breast tissue is heterogeneously
dense, which may obscure small masses.
FINDINGS: In the right breast, at 12 o'clock, posteriorly, there is a partly
circumscribed oval mass which appears new from the prior mammogram.
There are no other right breast masses, no areas of architectural
distortion and no suspicious calcifications.

In the left breast, there are no masses, areas of architectural
distortion, areas of new or significant asymmetry or suspicious
calcifications.

Mammographic images were processed with CAD.

On physical exam, no discrete mass is palpated in the supra-areolar
region of the left breast.

Targeted left breast ultrasound is performed, showing heterogeneous
fibroglandular tissue in the retroareolar/supra-areolar left breast
and throughout the medial left breast where the patient reports
pain. No mass, cyst or suspicious lesion.

Targeted right breast ultrasound is performed, showing a cyst at 12
o'clock, 4 cm the nipple, oval in shape, measuring 7 x 3 x 6 mm,
consistent in size, shape and location to the mammographic mass.
IMPRESSION: 1. No evidence of breast malignancy.
2. Benign right breast cyst.

RECOMMENDATION:
Screening mammogram in one year.(Code:[id number])

I have discussed the findings and recommendations with the patient.
If applicable, a reminder letter will be sent to the patient
regarding the next appointment.

BI-RADS CATEGORY  2: Benign.

## 2020-05-27 IMAGING — MG DIGITAL DIAGNOSTIC BILAT W/ TOMO W/ CAD
6 of 10 series · 6 of 30 positions shown · non-contrast
Comparison: 05/02/2015

CLINICAL DATA: Patient presents with a palpable lump, reported by
her husband, in the superior retroareolar left breast. She also
reports pain along the medial aspect of the left breast.

EXAM:
DIGITAL DIAGNOSTIC BILATERAL MAMMOGRAM WITH CAD AND TOMO
ULTRASOUND BILATERAL BREAST

[R MLO synth-2D (1 of 2)]
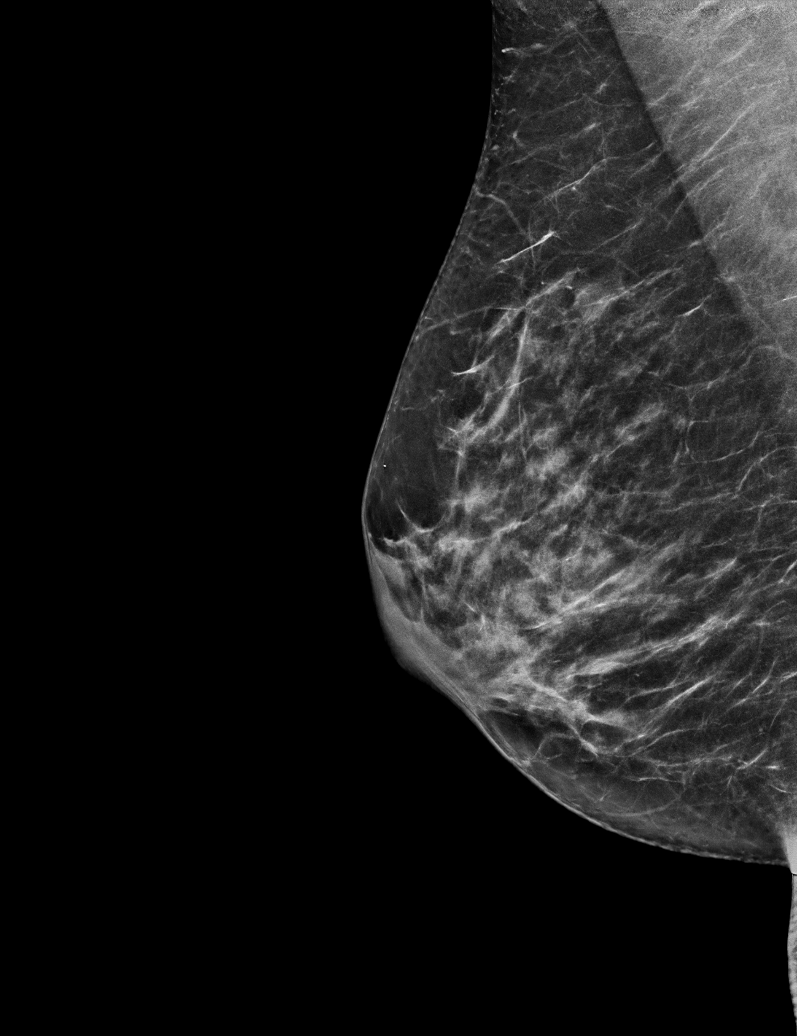

[R CC synth-2D]
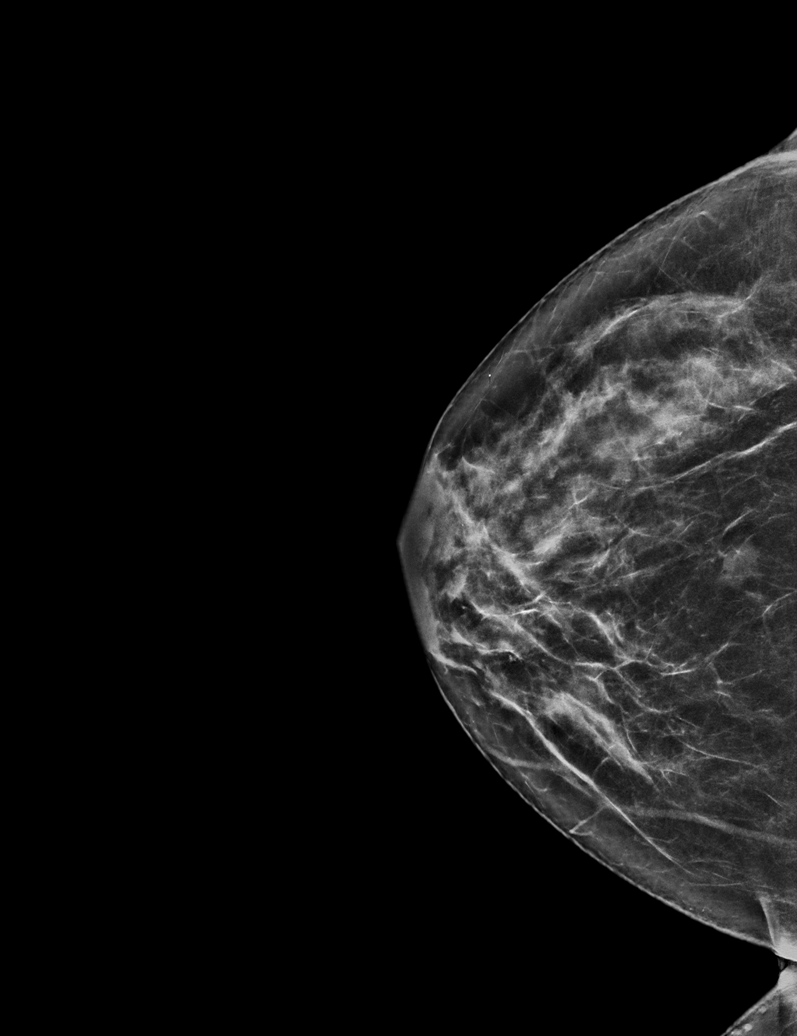

[L MLO synth-2D]
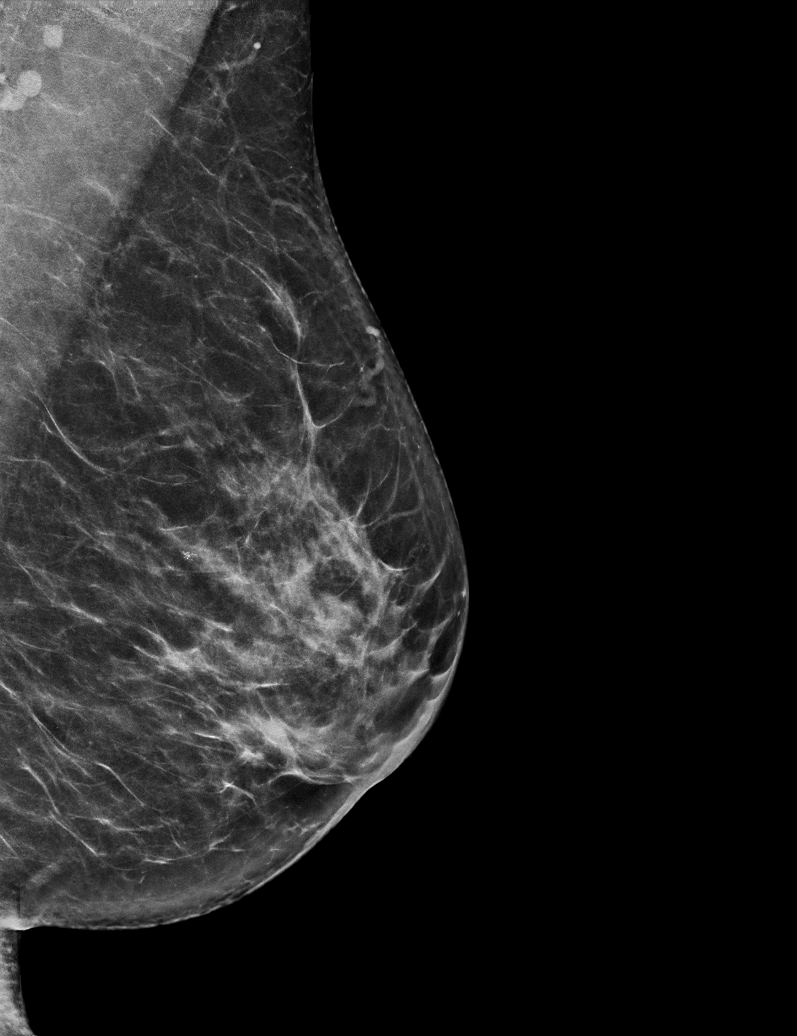

[R MLO synth-2D (2 of 2)]
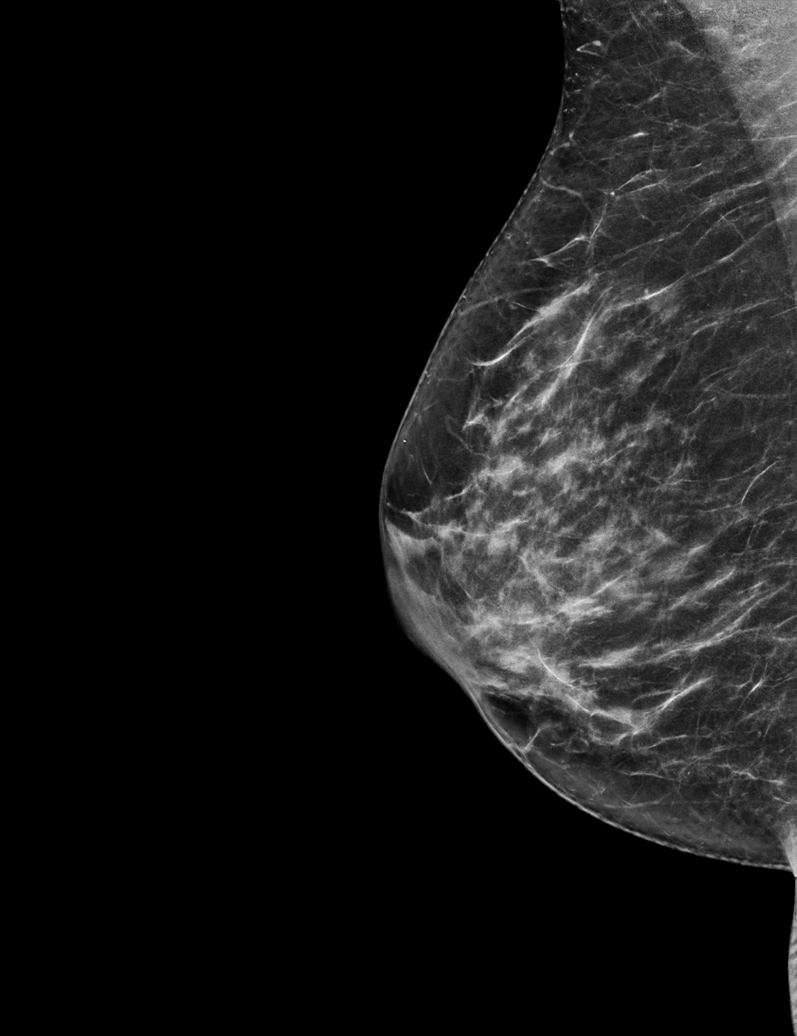

[L CC synth-2D]
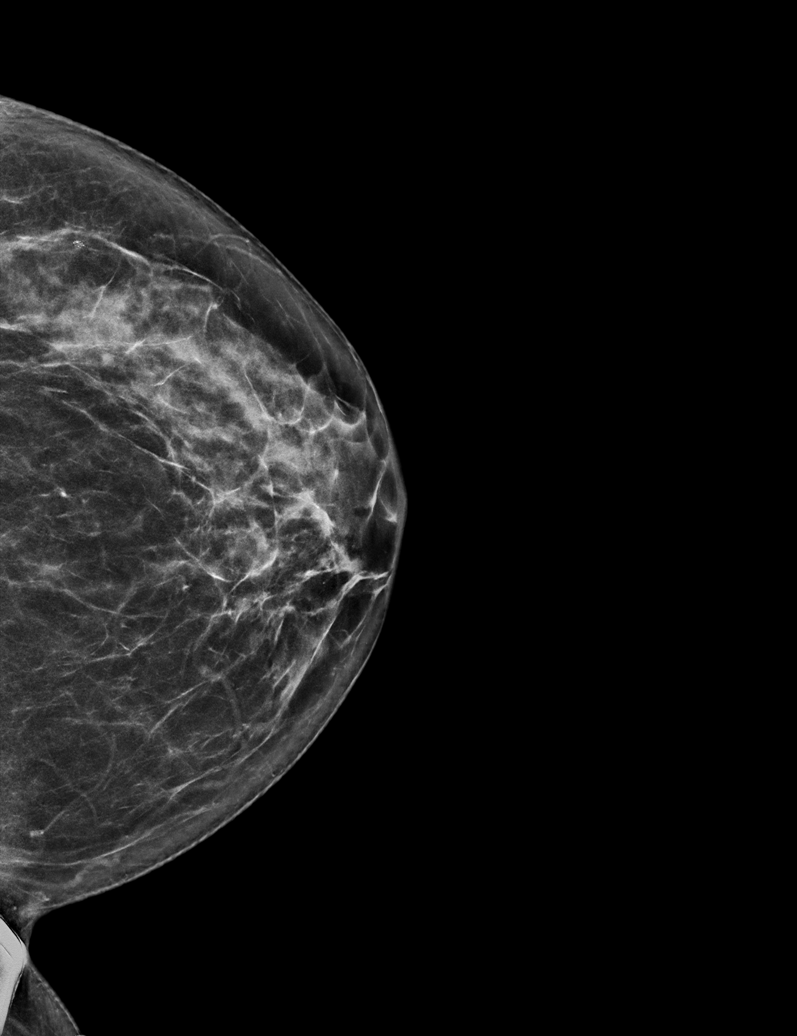

[L CC tomo · tomo slice 31/62.0]
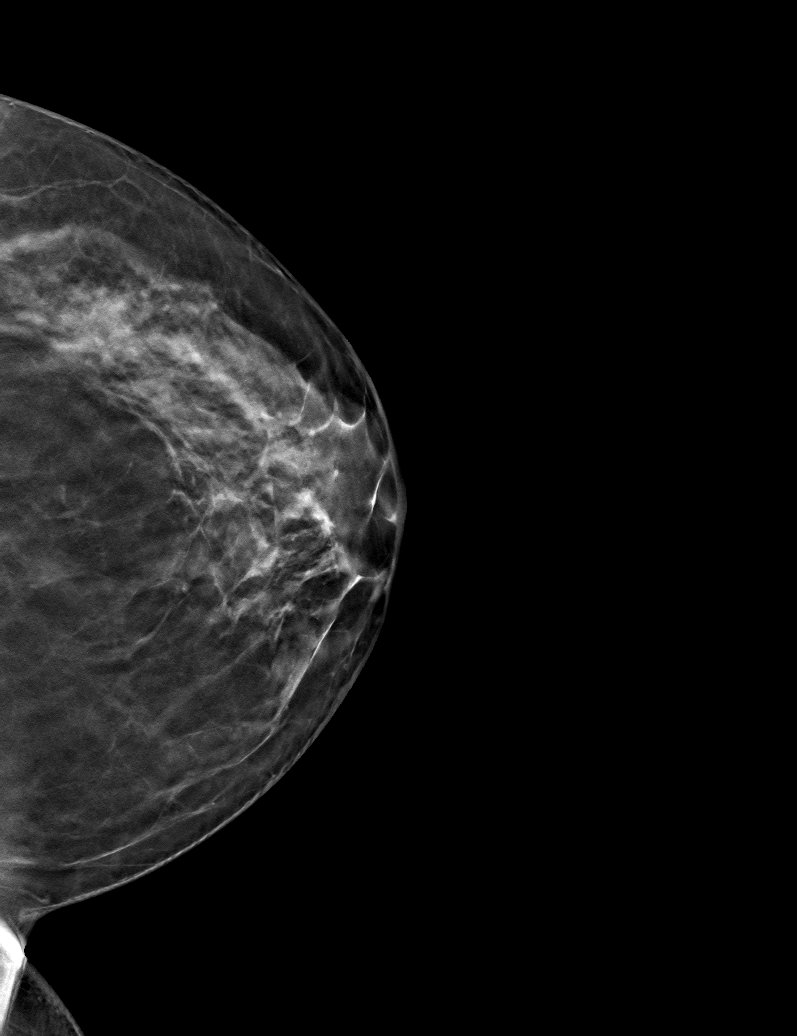

[6 of 30 positions shown; findings below may reference images not displayed]

ACR Breast Density Category c: The breast tissue is heterogeneously
dense, which may obscure small masses.
FINDINGS: In the right breast, at 12 o'clock, posteriorly, there is a partly
circumscribed oval mass which appears new from the prior mammogram.
There are no other right breast masses, no areas of architectural
distortion and no suspicious calcifications.

In the left breast, there are no masses, areas of architectural
distortion, areas of new or significant asymmetry or suspicious
calcifications.

Mammographic images were processed with CAD.

On physical exam, no discrete mass is palpated in the supra-areolar
region of the left breast.

Targeted left breast ultrasound is performed, showing heterogeneous
fibroglandular tissue in the retroareolar/supra-areolar left breast
and throughout the medial left breast where the patient reports
pain. No mass, cyst or suspicious lesion.

Targeted right breast ultrasound is performed, showing a cyst at 12
o'clock, 4 cm the nipple, oval in shape, measuring 7 x 3 x 6 mm,
consistent in size, shape and location to the mammographic mass.
IMPRESSION: 1. No evidence of breast malignancy.
2. Benign right breast cyst.

RECOMMENDATION:
Screening mammogram in one year.(Code:[id number])

I have discussed the findings and recommendations with the patient.
If applicable, a reminder letter will be sent to the patient
regarding the next appointment.

BI-RADS CATEGORY  2: Benign.

## 2020-06-28 ENCOUNTER — Other Ambulatory Visit: Payer: BC Managed Care – PPO | Admitting: Family Medicine

## 2020-06-28 DIAGNOSIS — M5412 Radiculopathy, cervical region: Secondary | ICD-10-CM

## 2020-07-04 ENCOUNTER — Encounter: Payer: Self-pay | Admitting: Oncology

## 2020-07-09 ENCOUNTER — Other Ambulatory Visit: Payer: BC Managed Care – PPO

## 2020-11-17 ENCOUNTER — Other Ambulatory Visit: Payer: Self-pay

## 2020-11-17 ENCOUNTER — Encounter: Payer: Self-pay | Admitting: Oncology

## 2020-11-17 ENCOUNTER — Emergency Department
Admission: EM | Admit: 2020-11-17 | Discharge: 2020-11-17 | Disposition: A | Discharge status: Home / Self Care | Payer: Managed Care, Other (non HMO) | Attending: Emergency Medicine | Admitting: Emergency Medicine

## 2020-11-17 DIAGNOSIS — G43919 Migraine, unspecified, intractable, without status migrainosus: Secondary | ICD-10-CM | POA: Insufficient documentation

## 2020-11-17 DIAGNOSIS — Z79899 Other long term (current) drug therapy: Secondary | ICD-10-CM | POA: Diagnosis not present

## 2020-11-17 DIAGNOSIS — I10 Essential (primary) hypertension: Secondary | ICD-10-CM | POA: Insufficient documentation

## 2020-11-17 DIAGNOSIS — G43019 Migraine without aura, intractable, without status migrainosus: Secondary | ICD-10-CM

## 2020-11-17 LAB — COMPREHENSIVE METABOLIC PANEL
ALT: 24 U/L (ref 0–44)
AST: 25 U/L (ref 15–41)
Albumin: 4.2 g/dL (ref 3.5–5.0)
Alkaline Phosphatase: 57 U/L (ref 38–126)
Anion gap: 8 (ref 5–15)
BUN: 14 mg/dL (ref 6–20)
CO2: 24 mmol/L (ref 22–32)
Calcium: 9.3 mg/dL (ref 8.9–10.3)
Chloride: 105 mmol/L (ref 98–111)
Creatinine, Ser: 0.7 mg/dL (ref 0.44–1.00)
GFR, Estimated: >60 mL/min (ref >60)
Glucose, Bld: 97 mg/dL (ref 70–99)
Potassium: 4 mmol/L (ref 3.5–5.1)
Sodium: 137 mmol/L (ref 135–145)
Total Bilirubin: 1.6 mg/dL — ABNORMAL HIGH (ref 0.3–1.2)
Total Protein: 7.9 g/dL (ref 6.5–8.1)

## 2020-11-17 LAB — CBC
HCT: 38.3 % (ref 36.0–46.0)
Hemoglobin: 13.2 g/dL (ref 12.0–15.0)
MCH: 30.6 pg (ref 26.0–34.0)
MCHC: 34.5 g/dL (ref 30.0–36.0)
MCV: 88.7 fL (ref 80.0–100.0)
Platelets: 254 10*3/uL (ref 150–400)
RBC: 4.32 MIL/uL (ref 3.87–5.11)
RDW: 14.7 % (ref 11.5–15.5)
WBC: 9.2 10*3/uL (ref 4.0–10.5)
nRBC: 0 % (ref 0.0–0.2)

## 2020-11-17 MED ORDER — BUTALBITAL-APAP-CAFFEINE 50-325-40 MG PO TABS: 1.0000 | ORAL_TABLET | Freq: Four times a day (QID) | ORAL | 0 refills | Status: AC | PRN

## 2020-11-17 MED ORDER — DIPHENHYDRAMINE HCL 50 MG/ML IJ SOLN
50.0000 mg | Freq: Once | INTRAMUSCULAR | Status: AC
  Administered 2020-11-17: 50 mg via INTRAMUSCULAR
  Filled 2020-11-17: qty 1

## 2020-11-17 MED ORDER — KETOROLAC TROMETHAMINE 30 MG/ML IJ SOLN
30.0000 mg | Freq: Once | INTRAMUSCULAR | Status: AC
  Administered 2020-11-17: 30 mg via INTRAMUSCULAR
  Filled 2020-11-17: qty 1

## 2020-11-17 MED ORDER — PROMETHAZINE HCL 25 MG/ML IJ SOLN
25.0000 mg | Freq: Four times a day (QID) | INTRAMUSCULAR | Status: DC | PRN
  Administered 2020-11-17: 25 mg via INTRAMUSCULAR
  Filled 2020-11-17: qty 1

## 2020-11-17 MED ORDER — BUTALBITAL-APAP-CAFFEINE 50-325-40 MG PO TABS
2.0000 | ORAL_TABLET | Freq: Once | ORAL | Status: AC
  Administered 2020-11-17: 2 via ORAL
  Filled 2020-11-17: qty 2

## 2020-11-17 NOTE — ED Triage Notes (Signed)
Pt to ER via POV with complaints of headache that started this morning. Reports history of migraines. Reports at this point she is so nauseated form the pain she is unable to keep anything down PO. Has been taking tylenol and ibuprofen without relief from symptoms.

## 2020-11-17 NOTE — ED Provider Notes (Signed)
[facility] Emergency Department Provider Note  ____________________________________________  Time seen: Approximately 8:10 PM  I have reviewed the triage vital signs and the nursing notes.   HISTORY  Chief Complaint Headache    HPI Chelsea Carroll is a 45 y.o. female who presents the emergency department complaining of migraine headache.  Patient states that she has regular migraines.  It is seems to be hormonal-based with midmonth as well as headaches coinciding with her menstrual cycle.  Patient has been seen by a specialist in the past, diagnosed with migraines and a been placed on triptans.  Patient states that the triptans caused her to have chest pain and a feeling like she was having a heart attack.  She does not take any regular medications at this time for headaches.  She states that the headaches have been increasing in intensity.  There is no atypical features of this headache when compared to others other than she would like medications to alleviate symptoms.  This was not the worst headache of her life, did not start as a thunderclap headache.  No associated trauma, fevers, chills, or other symptoms.  Patient is here for medications for this migraine.       Past Medical History:  Diagnosis Date   Anemia    Bilateral arm numbness and tingling while sleeping    pt suspects pinched nerves in neck   Chest pain 06/19/2013   Crohn disease (HCC)    Epigastric pain    H/O blood clots    Heart palpitations 06/19/2013   Hemorrhoids    Hiatal hernia    HTN (hypertension) 06/19/2013   Hyperlipidemia 06/19/2013   Hypertension    Intussusception intestine (HCC)    Menstrual migraine 07/27/2015   Migraines    SBO (small bowel obstruction) (HCC) 03/17/2016   Thrombosis, portal vein     Patient Active Problem List   Diagnosis Date Noted   PMDD (premenstrual dysphoric disorder) 04/02/2017   Iron deficiency anemia 04/18/2016   Thrombosis, portal vein    SBO  (small bowel obstruction) (HCC) 03/17/2016   Epigastric pain    Intussusception intestine (HCC)    Menstrual migraine 07/27/2015   Heart palpitations 06/19/2013   Chest pain 06/19/2013   HTN (hypertension) 06/19/2013   Hyperlipidemia 06/19/2013    Past Surgical History:  Procedure Laterality Date   APPENDECTOMY     BOWEL RESECTION  2018   COLPOSCOPY  12/2006   LAPAROTOMY N/A 03/17/2016   Procedure: EXPLORATORY LAPAROTOMY,right colon resection.terminal resection,appendectomy;  Surgeon: Lattie Haw, MD;  Location: [facility] ORS;  Service: General;  Laterality: N/A;    Prior to Admission medications   Medication Sig Start Date End Date Taking? Authorizing Provider  butalbital-acetaminophen-caffeine (FIORICET) 50-325-40 MG tablet Take 1 tablet by mouth every 6 (six) hours as needed for headache. 11/17/20 11/17/21 Yes Kelisha Dall, Delorise Royals, PA-C  ALPRAZolam Prudy Feeler) 0.25 MG tablet TAKE ONE TABLET BY MOUTH ONCE DAILY AT BEDTIME 06/15/13   [provider]  carvedilol (COREG) 6.25 MG tablet Take 1 tablet (6.25 mg total) by mouth 2 (two) times daily. Patient taking differently: Take 6.25 mg by mouth daily.  05/17/16   Iran Ouch, MD  ibuprofen (ADVIL,MOTRIN) 200 MG tablet Take 200 mg by mouth every 6 (six) hours as needed.    [provider]  lisinopril (PRINIVIL,ZESTRIL) 5 MG tablet Take 1 tablet (5 mg total) by mouth daily. 05/17/16   Iran Ouch, MD  Multiple Vitamin (MULTIVITAMIN) tablet Take 1 tablet  by mouth daily.    [provider]  sertraline (ZOLOFT) 25 MG tablet Take by mouth.    [provider]    Allergies Iodine  Family History  Problem Relation Age of Onset   Hyperlipidemia Mother    Hypertension Mother    Bladder Cancer Father    Leukemia Father    Other Father        Myocardial Infarction, old/healed    Social History Social History   Tobacco Use   Smoking status: Never   Smokeless tobacco: Never  Vaping Use   Vaping  Use: Never used  Substance Use Topics   Alcohol use: Yes    Comment: occasional   Drug use: No     Review of Systems  Constitutional: No fever/chills Eyes: No visual changes. No discharge ENT: No upper respiratory complaints. Cardiovascular: no chest pain. Respiratory: no cough. No SOB. Gastrointestinal: No abdominal pain.  No nausea, no vomiting.  No diarrhea.  No constipation. Musculoskeletal: Negative for musculoskeletal pain. Skin: Negative for rash, abrasions, lacerations, ecchymosis. Neurological: Recurrent migraine headaches with 1 present today.  No atypical features.  Denies focal weakness or numbness.  10 System ROS otherwise negative.  ____________________________________________   PHYSICAL EXAM:  VITAL SIGNS: ED Triage Vitals  Enc Vitals Group     BP 11/17/20 1832 (!) 133/103     Pulse Rate 11/17/20 1832 89     Resp 11/17/20 1832 16     Temp 11/17/20 1832 98.5 F (36.9 C)     Temp Source 11/17/20 1832 Oral     SpO2 11/17/20 1832 98 %     Weight 11/17/20 1833 140 lb (63.5 kg)     Height 11/17/20 1833 5\' 3"  (1.6 m)     Head Circumference --      Peak Flow --      Pain Score 11/17/20 1833 9     Pain Loc --      Pain Edu? --      Excl. in [place]? --      Constitutional: Alert and oriented. Well appearing and in no acute distress. Eyes: Conjunctivae are normal. PERRL. EOMI. Head: Atraumatic. ENT:      Ears:       Nose: No congestion/rhinnorhea.      Mouth/Throat: Mucous membranes are moist.  Neck: No stridor.  No cervical spine tenderness to palpation. Hematological/Lymphatic/Immunilogical: No cervical lymphadenopathy. Cardiovascular: Normal rate, regular rhythm. Normal S1 and S2.  Good peripheral circulation. Respiratory: Normal respiratory effort without tachypnea or retractions. Lungs CTAB. Good air entry to the bases with no decreased or absent breath sounds. Musculoskeletal: Full range of motion to all extremities. No gross deformities  appreciated. Neurologic:  Normal speech and language. No gross focal neurologic deficits are appreciated.  Cranial nerves II through XII grossly intact. Skin:  Skin is warm, dry and intact. No rash noted. Psychiatric: Mood and affect are normal. Speech and behavior are normal. Patient exhibits appropriate insight and judgement.   ____________________________________________   LABS (all labs ordered are listed, but only abnormal results are displayed)  Labs Reviewed  COMPREHENSIVE METABOLIC PANEL - Abnormal; Notable for the following components:      Result Value   Total Bilirubin 1.6 (*)    All other components within normal limits  CBC  URINALYSIS, ROUTINE W REFLEX MICROSCOPIC  POC URINE PREG, ED   ____________________________________________  EKG   ____________________________________________  RADIOLOGY   No results found.  ____________________________________________    PROCEDURES  Procedure(s) performed:  Procedures    Medications  promethazine (PHENERGAN) injection 25 mg (25 mg Intramuscular Given 11/17/20 2022)  ketorolac (TORADOL) 30 MG/ML injection 30 mg (30 mg Intramuscular Given 11/17/20 2021)  diphenhydrAMINE (BENADRYL) injection 50 mg (50 mg Intramuscular Given 11/17/20 2021)  butalbital-acetaminophen-caffeine (FIORICET) 50-325-40 MG per tablet 2 tablet (2 tablets Oral Given 11/17/20 2022)     ____________________________________________   INITIAL IMPRESSION / ASSESSMENT AND PLAN / ED COURSE  Pertinent labs & imaging results that were available during my care of the patient were reviewed by me and considered in my medical decision making (see chart for details).  Review of the [place] CSRS was performed in accordance of the [facility] prior to dispensing any controlled drugs.  Clinical Course as of 11/17/20 2258  Thu Nov 17, 2020  2019 Patient presents the emergency department with a complaint of a migraine headache.  Patient does have a history of  migraines that seems to be hormonal days.  Patient will have migraines around her menstrual cycle as well as midmonth.  He is having increasing in intensity.  She has seen a specialist in the past and had been placed on triptans.  Patient is no longer taking these as she has had side effects on the medications.  Patient denies any atypical features.  Not worst headache of her life and did not start as a thunderclap.  Exam was reassuring with patient being neurologically intact.  At this time labs are reassuring.  No indication for imaging as this is a progression of atypical feature at a typical time for the patient.  We will treat with migraine cocktail plus Fioricet. [JC]    Clinical Course User Index [JC] Mesiah Manzo, Delorise Royals, PA-C          Patient's diagnosis is consistent with migraine headache.  Patient presents the emergency department with migraine.  History of same.  Headache is similar she is just looking for medication relief.  There was no atypical symptoms, not described as a thunderclap or worst headache of her life.  Labs are reassuring.  Patient states again typical headache that is traditionally hormone induced.  Patient received migraine cocktail and Fioricet with good improvement of symptoms.  At this time patient will be discharged with prescription for Fioricet.  Concerning signs and symptoms are discussed with the patient.  Follow-up with neurology to develop a plan to combat ongoing migraine headaches..  Patient is given ED precautions to return to the ED for any worsening or new symptoms.     ____________________________________________  FINAL CLINICAL IMPRESSION(S) / ED DIAGNOSES  Final diagnoses:  Intractable migraine without aura and without status migrainosus      NEW MEDICATIONS STARTED DURING THIS VISIT:  ED Discharge Orders          Ordered    butalbital-acetaminophen-caffeine (FIORICET) 50-325-40 MG tablet  Every 6 hours PRN        11/17/20 2146                 This chart was dictated using voice recognition software/Dragon. Despite best efforts to proofread, errors can occur which can change the meaning. Any change was purely unintentional.    Racheal Patches, PA-C 11/17/20 2258    Phineas Semen, MD 11/17/20 2317

## 2021-04-04 ENCOUNTER — Emergency Department
Admission: EM | Admit: 2021-04-04 | Discharge: 2021-04-04 | Disposition: A | Discharge status: Home / Self Care | Payer: BC Managed Care – PPO | Attending: Emergency Medicine | Admitting: Emergency Medicine

## 2021-04-04 ENCOUNTER — Emergency Department: Payer: BC Managed Care – PPO

## 2021-04-04 ENCOUNTER — Other Ambulatory Visit: Payer: Self-pay

## 2021-04-04 DIAGNOSIS — Z79899 Other long term (current) drug therapy: Secondary | ICD-10-CM | POA: Insufficient documentation

## 2021-04-04 DIAGNOSIS — Z7901 Long term (current) use of anticoagulants: Secondary | ICD-10-CM | POA: Diagnosis not present

## 2021-04-04 DIAGNOSIS — R000 Tachycardia, unspecified: Secondary | ICD-10-CM | POA: Diagnosis not present

## 2021-04-04 DIAGNOSIS — I880 Nonspecific mesenteric lymphadenitis: Secondary | ICD-10-CM | POA: Diagnosis not present

## 2021-04-04 DIAGNOSIS — K654 Sclerosing mesenteritis: Secondary | ICD-10-CM

## 2021-04-04 DIAGNOSIS — R112 Nausea with vomiting, unspecified: Secondary | ICD-10-CM | POA: Diagnosis not present

## 2021-04-04 DIAGNOSIS — K760 Fatty (change of) liver, not elsewhere classified: Secondary | ICD-10-CM | POA: Insufficient documentation

## 2021-04-04 LAB — CBC
HCT: 35.9 % — ABNORMAL LOW (ref 36.0–46.0)
Hemoglobin: 11.6 g/dL — ABNORMAL LOW (ref 12.0–15.0)
MCH: 28.8 pg (ref 26.0–34.0)
MCHC: 32.3 g/dL (ref 30.0–36.0)
MCV: 89.1 fL (ref 80.0–100.0)
Platelets: 231 10*3/uL (ref 150–400)
RBC: 4.03 MIL/uL (ref 3.87–5.11)
RDW: 14.6 % (ref 11.5–15.5)
WBC: 8.2 10*3/uL (ref 4.0–10.5)
nRBC: 0 % (ref 0.0–0.2)

## 2021-04-04 LAB — URINALYSIS, ROUTINE W REFLEX MICROSCOPIC
Bacteria, UA: NONE SEEN
Bilirubin Urine: NEGATIVE
Glucose, UA: NEGATIVE mg/dL
Ketones, ur: 20 mg/dL — AB
Leukocytes,Ua: NEGATIVE
Nitrite: NEGATIVE
Protein, ur: NEGATIVE mg/dL
Specific Gravity, Urine: 1.026 (ref 1.005–1.030)
Squamous Epithelial / HPF: NONE SEEN (ref 0–5)
pH: 5 (ref 5.0–8.0)

## 2021-04-04 LAB — HCG, QUANTITATIVE, PREGNANCY: hCG, Beta Chain, Quant, S: <1 m[IU]/mL (ref <5)

## 2021-04-04 LAB — COMPREHENSIVE METABOLIC PANEL
ALT: 23 U/L (ref 0–44)
AST: 28 U/L (ref 15–41)
Albumin: 3.7 g/dL (ref 3.5–5.0)
Alkaline Phosphatase: 56 U/L (ref 38–126)
Anion gap: 11 (ref 5–15)
BUN: 19 mg/dL (ref 6–20)
CO2: 24 mmol/L (ref 22–32)
Calcium: 8.4 mg/dL — ABNORMAL LOW (ref 8.9–10.3)
Chloride: 101 mmol/L (ref 98–111)
Creatinine, Ser: 0.84 mg/dL (ref 0.44–1.00)
GFR, Estimated: >60 mL/min (ref >60)
Glucose, Bld: 103 mg/dL — ABNORMAL HIGH (ref 70–99)
Potassium: 3.6 mmol/L (ref 3.5–5.1)
Sodium: 136 mmol/L (ref 135–145)
Total Bilirubin: 1.2 mg/dL (ref 0.3–1.2)
Total Protein: 6.8 g/dL (ref 6.5–8.1)

## 2021-04-04 LAB — LIPASE, BLOOD: Lipase: 25 U/L (ref 11–51)

## 2021-04-04 LAB — TROPONIN I (HIGH SENSITIVITY)
Troponin I (High Sensitivity): 9 ng/L (ref <18)
Troponin I (High Sensitivity): 16 ng/L (ref <18)
Troponin I (High Sensitivity): 11 ng/L (ref <18)

## 2021-04-04 LAB — MAGNESIUM: Magnesium: 1.7 mg/dL (ref 1.7–2.4)

## 2021-04-04 MED ORDER — KETOROLAC TROMETHAMINE 15 MG/ML IJ SOLN
15.0000 mg | Freq: Once | INTRAMUSCULAR | Status: AC
  Administered 2021-04-04: 15 mg via INTRAVENOUS
  Filled 2021-04-04 (×2): qty 1

## 2021-04-04 MED ORDER — ONDANSETRON HCL 4 MG/2ML IJ SOLN
4.0000 mg | INTRAMUSCULAR | Status: AC
  Administered 2021-04-04: 4 mg via INTRAVENOUS

## 2021-04-04 MED ORDER — MORPHINE SULFATE (PF) 4 MG/ML IV SOLN
4.0000 mg | Freq: Once | INTRAVENOUS | Status: AC
  Administered 2021-04-04: 4 mg via INTRAVENOUS
  Filled 2021-04-04: qty 1

## 2021-04-04 MED ORDER — SODIUM CHLORIDE 0.9 % IV BOLUS
1000.0000 mL | Freq: Once | INTRAVENOUS | Status: AC
  Administered 2021-04-04: 1000 mL via INTRAVENOUS

## 2021-04-04 MED ORDER — ONDANSETRON 4 MG PO TBDP: 4.0000 mg | ORAL_TABLET | Freq: Three times a day (TID) | ORAL | 0 refills | Status: AC | PRN

## 2021-04-04 MED ORDER — DEXAMETHASONE SODIUM PHOSPHATE 10 MG/ML IJ SOLN
10.0000 mg | Freq: Once | INTRAMUSCULAR | Status: AC
  Administered 2021-04-04: 10 mg via INTRAVENOUS
  Filled 2021-04-04: qty 1

## 2021-04-04 MED ORDER — CARVEDILOL 6.25 MG PO TABS
6.2500 mg | ORAL_TABLET | ORAL | Status: AC
  Administered 2021-04-04: 6.25 mg via ORAL
  Filled 2021-04-04: qty 1

## 2021-04-04 MED ORDER — HYDROMORPHONE HCL 1 MG/ML IJ SOLN
0.5000 mg | Freq: Once | INTRAMUSCULAR | Status: AC
  Administered 2021-04-04: 0.5 mg via INTRAVENOUS
  Filled 2021-04-04: qty 1

## 2021-04-04 MED ORDER — HYDROCODONE-ACETAMINOPHEN 5-325 MG PO TABS: 1.0000 | ORAL_TABLET | Freq: Four times a day (QID) | ORAL | 0 refills | Status: AC | PRN

## 2021-04-04 MED ORDER — PROCHLORPERAZINE EDISYLATE 10 MG/2ML IJ SOLN
10.0000 mg | Freq: Once | INTRAMUSCULAR | Status: AC
  Administered 2021-04-04: 10 mg via INTRAVENOUS
  Filled 2021-04-04: qty 2

## 2021-04-04 MED ORDER — DIPHENHYDRAMINE HCL 50 MG/ML IJ SOLN
25.0000 mg | Freq: Once | INTRAMUSCULAR | Status: AC
  Administered 2021-04-04: 25 mg via INTRAVENOUS
  Filled 2021-04-04: qty 1

## 2021-04-04 MED ORDER — ACETAMINOPHEN 10 MG/ML IV SOLN
1000.0000 mg | Freq: Four times a day (QID) | INTRAVENOUS | Status: DC
  Filled 2021-04-04 (×2): qty 100

## 2021-04-04 NOTE — ED Provider Notes (Signed)
[facility] Provider Note    Event Date/Time   First MD Initiated Contact with Patient 04/04/21 1114     (approximate)   History   Palpitations   HPI  Chelsea Carroll is a 46 y.o. female with a previous history of Crohn's disease, prior enteroenteric intussusception.  Previous abdominal surgery.  Prior portal venous thrombosis previously on anticoagulation  For about a day and a half now has had intractable vomiting.  Reports that her grandchild who she has been caring for and also the child's mother have had severe vomiting and diarrhea.  She believes that she acquired the same thing, started having vomiting and intractable nausea.  However she reports she has not had any diarrhea.  She does feel somewhat constipated with mild abdominal pain but quite a bit of nausea took Zofran this morning.  Does not currently wish for any pain medicine  No fevers.  She been feeling weak and fatigued and noticed when she stands up or moves about her heart feels like it is racing very quickly.  She also takes carvedilol which she uses for elevated heart rates and palpitations in the past, but has not been able to keep anything on her stomach and has not had her carvedilol for over 24 hours.  No chest pain no trouble breathing.  No fevers or chills.  No pain or burning with urination.    Reviewed clinic note from May 10, 2016 seen by Dr. Janese Banks  Reports having occasional monthly migraines that seem to correlate with her menopause, but no recent or acute illness until started about a day and a half ago with severe nausea and vomiting  No black or bloody emesis      Physical Exam   Triage Vital Signs: ED Triage Vitals  Enc Vitals Group     BP      Pulse      Resp      Temp      Temp src      SpO2      Weight      Height      Head Circumference      Peak Flow      Pain Score      Pain Loc      Pain Edu?      Excl. in [place]?     Most recent vital  signs: Vitals:   04/04/21 1950 04/04/21 2030  BP: 121/70 112/78  Pulse: 94 86  Resp: 20 14  Temp:    SpO2: 98% 96%     General: Awake, no distress.  She appears mildly fatigued, generally ill but in no acute distress.  Very pleasant. CV:  Good peripheral perfusion.  Tachycardic heart rate approximately 130 by palpation, no murmurs rubs or gallops. Resp:  Normal effort.  Clear lung sounds.  Normal work of breathing.  No rales or wheezing Abd:  No distention.  Old abdominal scars are well-healed.  She reports moderate tenderness to palpation throughout all quadrants on exam with slight focality to the pain more over the right and lower abdomen.  She does report previous appendectomy as well.  There is no frank peritonitis.  She does not appear to have findings consistent with an acute abdomen but rather more of a diffuse abdominal tenderness.  Bowel sounds are diminished Other:  Moves all extremities well.  Symmetric smile.  Fully alert and oriented. Peripherally well perfused warm hands normal skin color  ED Results / Procedures / Treatments   Labs (all labs ordered are listed, but only abnormal results are displayed) Labs Reviewed  URINALYSIS, ROUTINE W REFLEX MICROSCOPIC - Abnormal; Notable for the following components:      Result Value   Color, Urine YELLOW (*)    APPearance CLEAR (*)    Hgb urine dipstick SMALL (*)    Ketones, ur 20 (*)    All other components within normal limits  CBC - Abnormal; Notable for the following components:   Hemoglobin 11.6 (*)    HCT 35.9 (*)    All other components within normal limits  COMPREHENSIVE METABOLIC PANEL - Abnormal; Notable for the following components:   Glucose, Bld 103 (*)    Calcium 8.4 (*)    All other components within normal limits  GASTROINTESTINAL PANEL BY PCR, STOOL (REPLACES STOOL CULTURE)  HCG, QUANTITATIVE, PREGNANCY  LIPASE, BLOOD  MAGNESIUM  TROPONIN I (HIGH SENSITIVITY)  TROPONIN I (HIGH SENSITIVITY)   TROPONIN I (HIGH SENSITIVITY)     EKG  EKG is reviewed inter by me at 1130 Heart rate 120 QRS 75 QTc 440 Sinus tachycardia, mild T wave abnormality notably some mild ST segment depression in V3 and V4.  Of note, clinically the patient does not complain of any chest pain or dyspnea. EKG is slightly abnormal, will add on a troponin 1 , trop #2 both noted to be normal   RADIOLOGY  CT imaging the abdomen pelvis is personally reviewed and interpreted by me, I do not see clear evidence of obstruction.  However I do certainly defer the radiology report as below, and I did discuss the imaging as well as the report from radiologist with Dr. Allen Norris   IMPRESSION: 1. No acute abdominopelvic findings. 2. Severe hepatic steatosis. 3. Prior right hemicolectomy. No evidence of active bowel inflammation or bowel obstruction. 4. Multiple mildly enlarged retroperitoneal and mesenteric lymph nodes, slightly increased in size and number compared to the previous exam. Hazy increased attenuation within the central mesentery. Findings are nonspecific and could be reactive or secondary to mesenteric panniculitis. Electronically Signed   By: Davina Poke D.O.   On: 04/04/2021 13:26      PROCEDURES:  Critical Care performed: No  Procedures   MEDICATIONS ORDERED IN ED: Medications  sodium chloride 0.9 % bolus 1,000 mL (0 mLs Intravenous Stopped 04/04/21 1631)  sodium chloride 0.9 % bolus 1,000 mL (0 mLs Intravenous Stopped 04/04/21 1247)  ondansetron (ZOFRAN) injection 4 mg (4 mg Intravenous Given 04/04/21 1153)  morphine (PF) 4 MG/ML injection 4 mg (4 mg Intravenous Given 04/04/21 1431)  carvedilol (COREG) tablet 6.25 mg (6.25 mg Oral Given 04/04/21 1535)  ketorolac (TORADOL) 15 MG/ML injection 15 mg (15 mg Intravenous Given 04/04/21 1955)  prochlorperazine (COMPAZINE) injection 10 mg (10 mg Intravenous Given 04/04/21 2009)  diphenhydrAMINE (BENADRYL) injection 25 mg (25 mg Intravenous Given 04/04/21 1957)   dexamethasone (DECADRON) injection 10 mg (10 mg Intravenous Given 04/04/21 1959)  HYDROmorphone (DILAUDID) injection 0.5 mg (0.5 mg Intravenous Given 04/04/21 2000)     IMPRESSION / MDM / ASSESSMENT AND PLAN / ED COURSE  I reviewed the triage vital signs and the nursing notes.                              Differential diagnosis includes but is not limited to, abdominal perforation, aortic dissection, cholecystitis, appendicitis, diverticulitis, colitis, esophagitis/gastritis, kidney stone, pyelonephritis, urinary tract infection, aortic aneurysm.  All are considered in decision and treatment plan. Based upon the patient's presentation and risk factors, given the patient's history of previous Crohn's disease as well as intussusception and bowel surgeries we will proceed with generous hydration as she appears dry and dehydrated, and also obtain imaging.  She does report a notable iodine allergy, and discussed with her and with shared medical decision making and given her presentation and history we will forego iodinated contrast.  I do not suspect an acute vascular etiology on today's presentation either.  The sent was in multiple for members with similar sound consistent with possible a acute viral gastroenteritis-like picture, however given her complicated history we will proceed with imaging.  Hydrate reassess check labs etc.  Suspect her palpitations are likely due to tachycardia from dehydration and possibly secondary to not having her carvedilol      Clinical Course as of 04/05/21 1552  Tue Apr 04, 2021  1410 Clinical history, past surgery, today's labs and imaging discussed with Dr. Allen Norris  [MQ]    Clinical Course User Index [MQ] Delman Kitten, MD   Patient reports she is feeling improved after receiving IV fluids and antiemetic.  She would like to try eating something and taking her COVID-19 positive test (U07.1, COVID-19) with Acute Respiratory Distress Syndrome (ARDS) (J80, ARDS) (If  respiratory failure or sepsis present, add as separate assessment)   Ongoing care assigned to Dr. Rip Harbour, follow-up on reassessment after p.o. challenge.  If patient passing pain and symptoms well controlled would anticipate likely discharge to follow-up closely with Dr. Allen Norris and PCP.   Return precautions and treatment recommendations and follow-up plan were already discussed with the patient who is agreeable with the plan, and understands Dr. Rip Harbour will be following up to reevaluate her here in the ER.    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   Delman Kitten, MD 04/05/21 [phone]

## 2021-04-04 NOTE — Discharge Instructions (Addendum)
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  There is no need to take antibiotics with these.  I do want you to follow-up with Dr.Wohl the gastroenterologist.  Give his office a call in the morning and schedule appointment for sometime next week or 2.  I have given you a prescription for some Vicodin if you need something for the pain.  Do not take this with the Fioricet.  That could be too much pain medicine.  If you need anything for nausea and vomiting I have given you Zofran melt on your tongue wafers 1 wafer 3 times a day as needed.  Make sure you are getting enough water.  Start with sips of clear liquid tonight and tomorrow try a very bland diet nibble small amounts frequently if you tolerate that progressed your regular diet.  Please return for increasing pain or fever or if you cannot keep anything down.  Let Dr. Servando Snare know that you also had what looks like hepatic steatosis on the CT scan.  This is also called fatty liver.  There does not seem to be a good reason for you to have that he can work on that further.

## 2021-04-04 NOTE — ED Notes (Signed)
Pt teaching provided on medications that may cause drowsiness. Pt instructed not to drive or operate heavy machinery while taking the prescribed medication. Pt verbalized understanding.  ? ?Pt provided discharge instructions and prescription information. Pt was given the opportunity to ask questions and questions were answered. Discharge signature not obtained in the setting of the COVID-19 pandemic in order to reduce high touch surfaces.  ? ?

## 2021-04-04 NOTE — ED Triage Notes (Signed)
Pt BIB [facility] with a hx of N/V/D x 2-3 days. Pt has not been able to take her Carvedilol. Today pt is having racing heart. EMS report pt in ST with a rate of 120-140.

## 2021-04-04 NOTE — ED Notes (Signed)
Pt drinking clear liquids with no episodes of nausea or emesis.

## 2021-04-04 NOTE — ED Notes (Signed)
Pt c/o migraine HA prior to d/c. EDP notified. See new orders.

## 2021-04-04 NOTE — ED Provider Notes (Signed)
Ready to go but now has a migraine.  She says usually has to get a cocktail.  I will try some Toradol IV with Compazine IV Benadryl IV and some Dilaudid and a small dose.   Arnaldo Natal, MD 04/04/21 1950

## 2021-04-04 NOTE — ED Notes (Signed)
Pt states she broke out into a rash when she took APAP a month ago. Pt does not want to IV APAP.

## 2021-04-05 ENCOUNTER — Telehealth: Payer: Self-pay | Admitting: Physician Assistant

## 2021-04-05 MED ORDER — OXYCODONE-ACETAMINOPHEN 5-325 MG PO TABS: 1.0000 | ORAL_TABLET | ORAL | 0 refills | Status: AC | PRN

## 2021-04-05 NOTE — Telephone Encounter (Cosign Needed)
[facility] called and could not fill the hydrocodone.  Did send in a prescription for Percocet for the patient.  [facility] [address] [facility]

## 2021-04-07 ENCOUNTER — Ambulatory Visit (INDEPENDENT_AMBULATORY_CARE_PROVIDER_SITE_OTHER): Payer: Managed Care, Other (non HMO)

## 2021-04-07 ENCOUNTER — Encounter: Payer: Self-pay | Admitting: Cardiovascular Disease

## 2021-04-07 ENCOUNTER — Ambulatory Visit (INDEPENDENT_AMBULATORY_CARE_PROVIDER_SITE_OTHER): Payer: BC Managed Care – PPO | Admitting: Cardiovascular Disease

## 2021-04-07 ENCOUNTER — Other Ambulatory Visit: Payer: Self-pay

## 2021-04-07 ENCOUNTER — Encounter: Payer: Self-pay | Admitting: Oncology

## 2021-04-07 VITALS — BP 138/98 | HR 70 | Ht 62.0 in | Wt 146.1 lb

## 2021-04-07 DIAGNOSIS — R002 Palpitations: Secondary | ICD-10-CM

## 2021-04-07 DIAGNOSIS — R000 Tachycardia, unspecified: Secondary | ICD-10-CM

## 2021-04-07 DIAGNOSIS — I10 Essential (primary) hypertension: Secondary | ICD-10-CM | POA: Diagnosis not present

## 2021-04-07 NOTE — Patient Instructions (Signed)
Medication Instructions:  Your physician recommends that you continue on your current medications as directed. Please refer to the Current Medication list given to you today.  Make sure to take your Carvedilol (Coreg) twice a day.  *If you need a refill on your cardiac medications before your next appointment, please call your pharmacy*   Lab Work: None ordered If you have labs (blood work) drawn today and your tests are completely normal, you will receive your results only by: MyChart Message (if you have MyChart) OR A paper copy in the mail If you have any lab test that is abnormal or we need to change your treatment, we will call you to review the results.   Testing/Procedures: Your provider has ordered a heart monitor to wear for 14 days. This will be mailed to your home with instructions on placement. Once you have finished the time frame requested, you will return monitor in box provided.      Follow-Up: At [facility], you and your health needs are our priority.  As part of our continuing mission to provide you with exceptional heart care, we have created designated Provider Care Teams.  These Care Teams include your primary Cardiologist (physician) and Advanced Practice Providers (APPs -  Physician Assistants and Nurse Practitioners) who all work together to provide you with the care you need, when you need it.  We recommend signing up for the patient portal called "MyChart".  Sign up information is provided on this After Visit Summary.  MyChart is used to connect with patients for Virtual Visits (Telemedicine).  Patients are able to view lab/test results, encounter notes, upcoming appointments, etc.  Non-urgent messages can be sent to your provider as well.   To learn more about what you can do with MyChart, go to [email or web address].    Your next appointment:   As needed  The format for your next appointment:   In Person  Provider:   You may see Lorine Bears, MD  or one of the following Advanced Practice Providers on your designated Care Team:   Nicolasa Ducking, NP Eula Listen, PA-C Cadence Fransico Michael, [place]    Other Instructions N/A

## 2021-04-07 NOTE — Progress Notes (Signed)
Cardiology Office Note   Date:  04/07/2021   ID:  Chelsea Carroll, DOB May 16, 1975, MRN [id number]  PCP:  [place]land Pink, MD  Cardiologist:   Kathlyn Sacramento, MD   Chief Complaint  Patient presents with   Other    Palpitations no complaints today. Meds reviewed verbally with pt.      History of Present Illness: Chelsea Carroll is a 46 y.o. female who presents for a follow-up visit regarding palpitations. She has history of enteritis, iron deficiency anemia, portal vein thrombosis, essential hypertension, migraine disorder and generalized anxiety disorder.  She was evaluated by me in 2015 for atypical chest pain.  She had a stress echocardiogram that was normal.  Due to elevated blood pressure, she underwent renal artery duplex which showed no evidence of renal artery stenosis.  She has intolerance to multiple antihypertensive medications including losartan and amlodipine. Echocardiogram in 2018 showed normal LV systolic function and mild mitral regurgitation. She was hospitalized in 2018 with small bowel obstruction.  She underwent surgery and hemicolectomy with pathology showing acute enteritis complicated by an ileus with portal vein thrombosis.  She was treated with anticoagulation.  She was most recently seen in our office in June 2020 as a virtual visit for palpitations.  An echocardiogram and outpatient ZIO monitor were recommended but were not done.  She went to the emergency room recently with intractable vomiting due to suspected GI viral illness.  Her labs were unremarkable.  CT scan of the abdomen and pelvis showed no acute abnormalities but they did mention severe hepatosteatosis and multiple enlarged retroperitoneal and mesenteric lymph nodes which were nonspecific and could be reactive to mesenteric  panniculitis.  In the setting of recent GI illness, she noticed significant palpitations and tachycardia lasting about 30 to 40 minutes with mild lightheadedness with no  syncope.  No chest pain or shortness of breath.  She has not been seen since 2020.  Past Medical History:  Diagnosis Date   Anemia    Bilateral arm numbness and tingling while sleeping    pt suspects pinched nerves in neck   Chest pain 06/19/2013   Crohn disease ([place])    Epigastric pain    H/O blood clots    Heart palpitations 06/19/2013   Hemorrhoids    Hiatal hernia    HTN (hypertension) 06/19/2013   Hyperlipidemia 06/19/2013   Hypertension    Intussusception intestine ([place])    Menstrual migraine 07/27/2015   Migraines    SBO (small bowel obstruction) ([place]) 03/17/2016   Thrombosis, portal vein     Past Surgical History:  Procedure Laterality Date   APPENDECTOMY     BOWEL RESECTION  2018   COLPOSCOPY  12/2006   LAPAROTOMY N/A 03/17/2016   Procedure: EXPLORATORY LAPAROTOMY,right colon resection.terminal resection,appendectomy;  Surgeon: Florene Glen, MD;  Location: [facility] ORS;  Service: General;  Laterality: N/A;     Current Outpatient Medications  Medication Sig Dispense Refill   butalbital-acetaminophen-caffeine (FIORICET) 50-325-40 MG tablet Take 1 tablet by mouth every 6 (six) hours as needed for headache. 20 tablet 0   carvedilol (COREG) 6.25 MG tablet Take 1 tablet (6.25 mg total) by mouth 2 (two) times daily. (Patient taking differently: Take 6.25 mg by mouth daily.) 60 tablet 1   ibuprofen (ADVIL,MOTRIN) 200 MG tablet Take 200 mg by mouth every 6 (six) hours as needed.     lisinopril (PRINIVIL,ZESTRIL) 5 MG tablet Take 1 tablet (5 mg total) by mouth daily. 30 tablet 5  ondansetron (ZOFRAN-ODT) 4 MG disintegrating tablet Take 1 tablet (4 mg total) by mouth every 8 (eight) hours as needed for nausea or vomiting. 20 tablet 0   ALPRAZolam (XANAX) 0.25 MG tablet TAKE ONE TABLET BY MOUTH ONCE DAILY AT BEDTIME (Patient not taking: Reported on 04/07/2021)     HYDROcodone-acetaminophen (NORCO/VICODIN) 5-325 MG tablet Take 1 tablet by mouth every 6 (six) hours as needed for moderate  pain. (Patient not taking: Reported on 04/07/2021) 30 tablet 0   Multiple Vitamin (MULTIVITAMIN) tablet Take 1 tablet by mouth daily. (Patient not taking: Reported on 04/07/2021)     oxyCODONE-acetaminophen (PERCOCET) 5-325 MG tablet Take 1 tablet by mouth every 4 (four) hours as needed for severe pain. (Patient not taking: Reported on 04/07/2021) 20 tablet 0   sertraline (ZOLOFT) 25 MG tablet Take by mouth. (Patient not taking: Reported on 04/07/2021)     No current facility-administered medications for this visit.    Allergies:   Iodine    Social History:  The patient  reports that she has never smoked. She has never used smokeless tobacco. She reports current alcohol use. She reports that she does not use drugs.   Family History:  The patient's family history includes Bladder Cancer in her father; Hyperlipidemia in her mother; Hypertension in her mother; Leukemia in her father; Other in her father.    ROS:  Please see the history of present illness.   Otherwise, review of systems are positive for none.   All other systems are reviewed and negative.    PHYSICAL EXAM: VS:  BP (!) 138/98 (BP Location: Left Arm, Patient Position: Sitting, Cuff Size: Normal) Comment: BP recheck   Pulse 70    Ht 5\' 2"  (1.575 m)    Wt 146 lb 2 oz (66.3 kg)    LMP 03/27/2021    SpO2 94%    BMI 26.73 kg/m  , BMI Body mass index is 26.73 kg/m. GEN: Well nourished, well developed, in no acute distress  HEENT: normal  Neck: no JVD, carotid bruits, or masses Cardiac: RRR; no murmurs, rubs, or gallops,no edema  Respiratory:  clear to auscultation bilaterally, normal work of breathing GI: soft, nontender, nondistended, + BS MS: no deformity or atrophy  Skin: warm and dry, no rash Neuro:  Strength and sensation are intact Psych: euthymic mood, full affect   EKG:  EKG is ordered today. The ekg ordered today demonstrates normal sinus rhythm with no significant ST or T wave changes.  Normal PR and QT  intervals.   Recent Labs: 04/04/2021: ALT 23; BUN 19; Creatinine, Ser 0.84; Hemoglobin 11.6; Magnesium 1.7; Platelets 231; Potassium 3.6; Sodium 136    Lipid Panel    Component Value Date/Time   CHOL 266 (H) 09/16/2013 0857   TRIG 113 09/16/2013 0857   HDL 83 09/16/2013 0857   CHOLHDL 3.2 09/16/2013 0857   LDLCALC 160 (H) 09/16/2013 0857      Wt Readings from Last 3 Encounters:  04/07/21 146 lb 2 oz (66.3 kg)  04/04/21 140 lb (63.5 kg)  11/17/20 140 lb (63.5 kg)       No flowsheet data found.    ASSESSMENT AND PLAN:  1.  Palpitations: Intermittent tachycardia.  Possible supraventricular tachycardia.  I requested a 2-week ZIO monitor.  2.  Essential hypertension: Blood pressure is elevated but she has been taking carvedilol once daily instead of twice daily.  I instructed her to change back to twice daily dosing.  Continue lisinopril.  3.  Hyperlipidemia:  Most recent lipid profile in 2021 showed an LDL of 106.  Her HDL was high at 86 which should be protective.  4.  Family history of coronary artery disease: No symptoms of angina and negative ischemic work-up in 2015.  Coronary calcium score is reasonable to do at some point.    Disposition:   FU with me as needed.  Signed,  Kathlyn Sacramento, MD  04/07/2021 2:55 PM    [place]

## 2021-04-11 ENCOUNTER — Ambulatory Visit: Payer: Managed Care, Other (non HMO) | Admitting: Cardiovascular Disease

## 2021-04-16 DIAGNOSIS — R000 Tachycardia, unspecified: Secondary | ICD-10-CM

## 2021-04-16 DIAGNOSIS — R002 Palpitations: Secondary | ICD-10-CM | POA: Diagnosis not present

## 2021-06-09 ENCOUNTER — Telehealth: Payer: Self-pay

## 2021-06-09 NOTE — Telephone Encounter (Signed)
Called to give the patient zio results. Unable to lmom. Pt voicemail is full. ?

## 2021-06-09 NOTE — Telephone Encounter (Signed)
-----   Message from [place] Ouch, MD sent at 06/09/2021  2:45 PM EDT ----- ?Inform patient that outpatient monitor showed only short runs of supraventricular tachycardia.  These are not serious.  Continue carvedilol twice daily.  If symptoms worsen, we can consider increasing the dose of carvedilol in the future. ?

## 2022-12-11 ENCOUNTER — Encounter: Payer: Self-pay | Admitting: Oncology

## 2022-12-11 ENCOUNTER — Emergency Department: Payer: BC Managed Care – PPO

## 2022-12-11 ENCOUNTER — Other Ambulatory Visit: Payer: Self-pay

## 2022-12-11 ENCOUNTER — Encounter: Payer: Self-pay | Admitting: *Deleted

## 2022-12-11 ENCOUNTER — Emergency Department
Admission: EM | Admit: 2022-12-11 | Discharge: 2022-12-11 | Disposition: A | Discharge status: Home / Self Care | Payer: BC Managed Care – PPO | Attending: Emergency Medicine | Admitting: Emergency Medicine

## 2022-12-11 DIAGNOSIS — R42 Dizziness and giddiness: Secondary | ICD-10-CM | POA: Diagnosis present

## 2022-12-11 DIAGNOSIS — R109 Unspecified abdominal pain: Secondary | ICD-10-CM | POA: Diagnosis not present

## 2022-12-11 DIAGNOSIS — I10 Essential (primary) hypertension: Secondary | ICD-10-CM | POA: Insufficient documentation

## 2022-12-11 LAB — BASIC METABOLIC PANEL
Anion gap: 10 (ref 5–15)
BUN: 13 mg/dL (ref 6–20)
CO2: 23 mmol/L (ref 22–32)
Calcium: 8.9 mg/dL (ref 8.9–10.3)
Chloride: 102 mmol/L (ref 98–111)
Creatinine, Ser: 0.6 mg/dL (ref 0.44–1.00)
GFR, Estimated: >60 mL/min (ref >60)
Glucose, Bld: 101 mg/dL — ABNORMAL HIGH (ref 70–99)
Potassium: 4.1 mmol/L (ref 3.5–5.1)
Sodium: 135 mmol/L (ref 135–145)

## 2022-12-11 LAB — CBC
HCT: 34.1 % — ABNORMAL LOW (ref 36.0–46.0)
Hemoglobin: 10.9 g/dL — ABNORMAL LOW (ref 12.0–15.0)
MCH: 26.8 pg (ref 26.0–34.0)
MCHC: 32 g/dL (ref 30.0–36.0)
MCV: 84 fL (ref 80.0–100.0)
Platelets: 323 10*3/uL (ref 150–400)
RBC: 4.06 MIL/uL (ref 3.87–5.11)
RDW: 16.6 % — ABNORMAL HIGH (ref 11.5–15.5)
WBC: 6.4 10*3/uL (ref 4.0–10.5)
nRBC: 0 % (ref 0.0–0.2)

## 2022-12-11 LAB — TROPONIN I (HIGH SENSITIVITY): Troponin I (High Sensitivity): 3 ng/L (ref <18)

## 2022-12-11 MED ORDER — LORAZEPAM 1 MG PO TABS
1.0000 mg | ORAL_TABLET | Freq: Once | ORAL | Status: AC
  Administered 2022-12-11: 1 mg via ORAL
  Filled 2022-12-11: qty 1

## 2022-12-11 MED ORDER — LORAZEPAM 0.5 MG PO TABS: 0.5000 mg | ORAL_TABLET | Freq: Every day | ORAL | 0 refills | Status: AC | PRN

## 2022-12-11 NOTE — ED Triage Notes (Signed)
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 Pain to abd with palpation. Increased dizziness with movement.

## 2022-12-11 NOTE — ED Provider Notes (Signed)
[facility] Provider Note    Event Date/Time   First MD Initiated Contact with Patient 12/11/22 1950     (approximate)  History   Chief Complaint: Dizziness  HPI  Chelsea Carroll is a 47 y.o. female with a past medical history of anemia, anxiety, hypertension, presents to the emergency department for dizzy/woozy feeling per patient.  According to the patient 1 4 PM she was at work when she began feeling lightheaded and dizzy.  States she is feeling like she might pass out.  Patient states that symptoms have dissipated quite a bit but still mildly present.  Patient states she was having some abdominal cramping although no longer is.  Patient denies any chest pain or shortness of breath at any point.  No vomiting no recent cough congestion or fever.  Physical Exam   Triage Vital Signs: ED Triage Vitals  Encounter Vitals Group     BP 12/11/22 1801 (!) 126/93     Systolic BP Percentile --      Diastolic BP Percentile --      Pulse Rate 12/11/22 1801 81     Resp 12/11/22 1801 18     Temp 12/11/22 1801 98.3 F (36.8 C)     Temp Source 12/11/22 1801 Oral     SpO2 12/11/22 1801 99 %     Weight 12/11/22 1757 145 lb (65.8 kg)     Height 12/11/22 1757 5\' 3"  (1.6 m)     Head Circumference --      Peak Flow --      Pain Score 12/11/22 1757 0     Pain Loc --      Pain Education --      Exclude from Growth Chart --     Most recent vital signs: Vitals:   12/11/22 1801  BP: (!) 126/93  Pulse: 81  Resp: 18  Temp: 98.3 F (36.8 C)  SpO2: 99%    General: Awake, no distress.  CV:  Good peripheral perfusion.  Regular rate and rhythm  Resp:  Normal effort.  Equal breath sounds bilaterally.  Abd:  No distention.  Soft, nontender.  No rebound or guarding.  ED Results / Procedures / Treatments   EKG  EKG viewed and interpreted by myself shows a normal sinus rhythm at 82 bpm with a narrow QRS, normal axis, normal intervals, no concerning ST  changes.  RADIOLOGY  I reviewed and interpreted chest x-ray images.  No findings on my evaluation.  No consolidation.   MEDICATIONS ORDERED IN ED: Medications  LORazepam (ATIVAN) tablet 1 mg (has no administration in time range)     IMPRESSION / MDM / ASSESSMENT AND PLAN / ED COURSE  I reviewed the triage vital signs and the nursing notes.  Patient's presentation is most consistent with acute presentation with potential threat to life or bodily function.  Patient presents the emergency department for a dizzy/woozy sensation while at work.  Patient states her symptoms have largely resolved but she still minimally has symptoms.  Patient states she has had similar symptoms in the past due to anxiety.  Patient used to take Xanax for her anxiety but states she has not been taking this medication for several years.  Patient's lab work is overall reassuring with a normal CBC, reassuring chemistry, negative troponin.  Chest x-ray is clear and EKG reassuring.  Patient is reassured by today's workup.  She still feels somewhat anxious.  Offered a tablet of Ativan as well as  a short prescription and the patient can follow-up with her doctor to discuss whether or not she needs to be back on this medication to be used as needed.  Patient states she rarely used it when she did have it.  We will discharge the patient with PCP follow-up.  FINAL CLINICAL IMPRESSION(S) / ED DIAGNOSES   Dizziness  Rx / DC Orders   Ativan  Note:  This document was prepared using Dragon voice recognition software and may include unintentional dictation errors.   Minna Antis, MD 12/11/22 2022

## 2022-12-11 NOTE — ED Triage Notes (Signed)
Pt brought in via ems from work.  Pt had episode of dizziness and upper abd pain. No headache.  No chest pain or sob.  No n/v/d.  Pt alert  speech clear.

## 2022-12-11 NOTE — ED Notes (Signed)
Pt verbalizes understanding of discharge instructions. Opportunity for questioning and answers were provided. Pt discharged from ED to home with husband.    

## 2023-04-23 ENCOUNTER — Other Ambulatory Visit: Payer: Self-pay | Admitting: Family Medicine

## 2023-04-23 DIAGNOSIS — Z1231 Encounter for screening mammogram for malignant neoplasm of breast: Secondary | ICD-10-CM

## 2023-05-07 ENCOUNTER — Encounter

## 2023-05-16 ENCOUNTER — Ambulatory Visit
Admission: RE | Admit: 2023-05-16 | Discharge: 2023-05-16 | Disposition: A | Discharge status: Home / Self Care | Source: Ambulatory Visit | Attending: Family Medicine | Admitting: Family Medicine

## 2023-05-16 DIAGNOSIS — Z1231 Encounter for screening mammogram for malignant neoplasm of breast: Secondary | ICD-10-CM | POA: Insufficient documentation
